# Patient Record
Sex: Female | Born: 1970
Health system: Southern US, Community
[De-identification: ages and names within clinical notes are randomized; demographics above are authoritative.]

## PROBLEM LIST (undated history)

## (undated) DIAGNOSIS — R519 Headache, unspecified: Secondary | ICD-10-CM

## (undated) DIAGNOSIS — C801 Malignant (primary) neoplasm, unspecified: Secondary | ICD-10-CM

## (undated) DIAGNOSIS — F419 Anxiety disorder, unspecified: Secondary | ICD-10-CM

## (undated) DIAGNOSIS — Z87442 Personal history of urinary calculi: Secondary | ICD-10-CM

## (undated) DIAGNOSIS — R51 Headache: Secondary | ICD-10-CM

## (undated) DIAGNOSIS — J189 Pneumonia, unspecified organism: Secondary | ICD-10-CM

## (undated) HISTORY — PX: KNEE SURGERY: SHX244

## (undated) HISTORY — PX: HAND SURGERY: SHX662

---

## 2010-02-09 DIAGNOSIS — J189 Pneumonia, unspecified organism: Secondary | ICD-10-CM

## 2010-02-09 HISTORY — DX: Pneumonia, unspecified organism: J18.9

## 2011-02-10 DIAGNOSIS — C801 Malignant (primary) neoplasm, unspecified: Secondary | ICD-10-CM

## 2011-02-10 HISTORY — PX: MASTECTOMY: SHX3

## 2011-02-10 HISTORY — DX: Malignant (primary) neoplasm, unspecified: C80.1

## 2016-09-14 ENCOUNTER — Encounter: Payer: Self-pay | Admitting: Internal Medicine

## 2016-11-16 DIAGNOSIS — Z880 Allergy status to penicillin: Secondary | ICD-10-CM | POA: Diagnosis not present

## 2016-11-16 DIAGNOSIS — R1084 Generalized abdominal pain: Secondary | ICD-10-CM | POA: Diagnosis not present

## 2016-11-16 DIAGNOSIS — Z853 Personal history of malignant neoplasm of breast: Secondary | ICD-10-CM | POA: Insufficient documentation

## 2016-11-16 DIAGNOSIS — K3580 Unspecified acute appendicitis: Principal | ICD-10-CM | POA: Insufficient documentation

## 2016-11-16 DIAGNOSIS — R109 Unspecified abdominal pain: Secondary | ICD-10-CM | POA: Diagnosis not present

## 2016-11-16 DIAGNOSIS — Z79899 Other long term (current) drug therapy: Secondary | ICD-10-CM | POA: Diagnosis not present

## 2016-11-16 DIAGNOSIS — Z9882 Breast implant status: Secondary | ICD-10-CM | POA: Diagnosis not present

## 2016-11-17 ENCOUNTER — Encounter (HOSPITAL_COMMUNITY)
Admission: EM | Disposition: A | Discharge status: Home / Self Care | Payer: Self-pay | Source: Home / Self Care | Attending: Emergency Medicine

## 2016-11-17 ENCOUNTER — Observation Stay (HOSPITAL_COMMUNITY): Payer: 59 | Admitting: Anesthesiology

## 2016-11-17 ENCOUNTER — Encounter (HOSPITAL_COMMUNITY): Payer: Self-pay

## 2016-11-17 ENCOUNTER — Observation Stay (HOSPITAL_COMMUNITY)
Admission: EM | Admit: 2016-11-17 | Discharge: 2016-11-18 | Disposition: A | Discharge status: Home / Self Care | Payer: 59 | Attending: General Surgery | Admitting: General Surgery

## 2016-11-17 ENCOUNTER — Emergency Department (HOSPITAL_COMMUNITY): Payer: 59

## 2016-11-17 DIAGNOSIS — Z853 Personal history of malignant neoplasm of breast: Secondary | ICD-10-CM | POA: Diagnosis not present

## 2016-11-17 DIAGNOSIS — Z880 Allergy status to penicillin: Secondary | ICD-10-CM | POA: Diagnosis not present

## 2016-11-17 DIAGNOSIS — R1084 Generalized abdominal pain: Secondary | ICD-10-CM | POA: Diagnosis not present

## 2016-11-17 DIAGNOSIS — K3580 Unspecified acute appendicitis: Secondary | ICD-10-CM

## 2016-11-17 DIAGNOSIS — K37 Unspecified appendicitis: Secondary | ICD-10-CM | POA: Diagnosis present

## 2016-11-17 DIAGNOSIS — R1031 Right lower quadrant pain: Secondary | ICD-10-CM | POA: Diagnosis not present

## 2016-11-17 HISTORY — PX: APPENDECTOMY: SHX54

## 2016-11-17 HISTORY — DX: Headache: R51

## 2016-11-17 HISTORY — DX: Personal history of urinary calculi: Z87.442

## 2016-11-17 HISTORY — DX: Malignant (primary) neoplasm, unspecified: C80.1

## 2016-11-17 HISTORY — PX: LAPAROSCOPIC APPENDECTOMY: SHX408

## 2016-11-17 HISTORY — DX: Headache, unspecified: R51.9

## 2016-11-17 LAB — CBC
HCT: 40.2 % (ref 36.0–46.0)
Hemoglobin: 13.6 g/dL (ref 12.0–15.0)
MCH: 31.7 pg (ref 26.0–34.0)
MCHC: 33.8 g/dL (ref 30.0–36.0)
MCV: 93.7 fL (ref 78.0–100.0)
PLATELETS: 232 10*3/uL (ref 150–400)
RBC: 4.29 MIL/uL (ref 3.87–5.11)
RDW: 12 % (ref 11.5–15.5)
WBC: 10.9 10*3/uL — AB (ref 4.0–10.5)

## 2016-11-17 LAB — COMPREHENSIVE METABOLIC PANEL
ALT: 15 U/L (ref 14–54)
ANION GAP: 10 (ref 5–15)
AST: 17 U/L (ref 15–41)
Albumin: 3.9 g/dL (ref 3.5–5.0)
Alkaline Phosphatase: 70 U/L (ref 38–126)
BUN: 16 mg/dL (ref 6–20)
CALCIUM: 9.1 mg/dL (ref 8.9–10.3)
CHLORIDE: 105 mmol/L (ref 101–111)
CO2: 23 mmol/L (ref 22–32)
CREATININE: 0.86 mg/dL (ref 0.44–1.00)
GFR calc non Af Amer: >60 mL/min (ref >60)
Glucose, Bld: 111 mg/dL — ABNORMAL HIGH (ref 65–99)
POTASSIUM: 3.7 mmol/L (ref 3.5–5.1)
SODIUM: 138 mmol/L (ref 135–145)
TOTAL PROTEIN: 6.6 g/dL (ref 6.5–8.1)
Total Bilirubin: 0.6 mg/dL (ref 0.3–1.2)

## 2016-11-17 LAB — URINALYSIS, ROUTINE W REFLEX MICROSCOPIC
Bilirubin Urine: NEGATIVE
GLUCOSE, UA: NEGATIVE mg/dL
KETONES UR: NEGATIVE mg/dL
LEUKOCYTES UA: NEGATIVE
NITRITE: NEGATIVE
PH: 5 (ref 5.0–8.0)
Protein, ur: NEGATIVE mg/dL
SPECIFIC GRAVITY, URINE: 1.018 (ref 1.005–1.030)

## 2016-11-17 LAB — TROPONIN I: Troponin I: <0.03 ng/mL (ref <0.03)

## 2016-11-17 LAB — LIPASE, BLOOD: LIPASE: 44 U/L (ref 11–51)

## 2016-11-17 LAB — PREGNANCY, URINE: PREG TEST UR: NEGATIVE

## 2016-11-17 LAB — I-STAT BETA HCG BLOOD, ED (MC, WL, AP ONLY)

## 2016-11-17 SURGERY — APPENDECTOMY, LAPAROSCOPIC
Anesthesia: General | Site: Abdomen

## 2016-11-17 MED ORDER — TEMAZEPAM 15 MG PO CAPS
15.0000 mg | ORAL_CAPSULE | Freq: Every day | ORAL | Status: DC
  Administered 2016-11-17: 15 mg via ORAL
  Filled 2016-11-17: qty 1

## 2016-11-17 MED ORDER — OXYCODONE HCL 5 MG PO TABS
ORAL_TABLET | ORAL | Status: AC
  Filled 2016-11-17: qty 1

## 2016-11-17 MED ORDER — ACETAMINOPHEN 650 MG RE SUPP: 650.0000 mg | Freq: Four times a day (QID) | RECTAL | Status: DC | PRN

## 2016-11-17 MED ORDER — BUPIVACAINE HCL (PF) 0.25 % IJ SOLN
INTRAMUSCULAR | Status: AC
Start: 2016-11-17 — End: 2016-11-17
  Filled 2016-11-17: qty 30

## 2016-11-17 MED ORDER — HYDROMORPHONE HCL 1 MG/ML IJ SOLN
1.0000 mg | Freq: Once | INTRAMUSCULAR | Status: AC
  Administered 2016-11-17: 1 mg via INTRAVENOUS
  Filled 2016-11-17: qty 1

## 2016-11-17 MED ORDER — ENOXAPARIN SODIUM 40 MG/0.4ML ~~LOC~~ SOLN
40.0000 mg | SUBCUTANEOUS | Status: DC
  Administered 2016-11-18: 40 mg via SUBCUTANEOUS
  Filled 2016-11-17: qty 0.4

## 2016-11-17 MED ORDER — ONDANSETRON HCL 4 MG/2ML IJ SOLN: 4.0000 mg | Freq: Four times a day (QID) | INTRAMUSCULAR | Status: DC | PRN

## 2016-11-17 MED ORDER — SODIUM CHLORIDE 0.9 % IR SOLN
Status: DC | PRN
  Administered 2016-11-17: 1000 mL

## 2016-11-17 MED ORDER — SUGAMMADEX SODIUM 200 MG/2ML IV SOLN
INTRAVENOUS | Status: AC
  Filled 2016-11-17: qty 2

## 2016-11-17 MED ORDER — DEXAMETHASONE SODIUM PHOSPHATE 10 MG/ML IJ SOLN
INTRAMUSCULAR | Status: AC
  Filled 2016-11-17: qty 1

## 2016-11-17 MED ORDER — SUGAMMADEX SODIUM 200 MG/2ML IV SOLN
INTRAVENOUS | Status: DC | PRN
  Administered 2016-11-17: 200 mg via INTRAVENOUS

## 2016-11-17 MED ORDER — OXYCODONE-ACETAMINOPHEN 5-325 MG PO TABS
ORAL_TABLET | ORAL | Status: AC
  Filled 2016-11-17: qty 1

## 2016-11-17 MED ORDER — FENTANYL CITRATE (PF) 250 MCG/5ML IJ SOLN
INTRAMUSCULAR | Status: AC
  Filled 2016-11-17: qty 5

## 2016-11-17 MED ORDER — CIPROFLOXACIN IN D5W 400 MG/200ML IV SOLN
400.0000 mg | Freq: Two times a day (BID) | INTRAVENOUS | Status: DC
  Administered 2016-11-17 – 2016-11-18 (×2): 400 mg via INTRAVENOUS
  Filled 2016-11-17 (×3): qty 200

## 2016-11-17 MED ORDER — BUPIVACAINE HCL 0.25 % IJ SOLN
INTRAMUSCULAR | Status: DC | PRN
Start: 2016-11-17 — End: 2016-11-17
  Administered 2016-11-17: 5 mL

## 2016-11-17 MED ORDER — DOCUSATE SODIUM 100 MG PO CAPS
100.0000 mg | ORAL_CAPSULE | Freq: Two times a day (BID) | ORAL | Status: DC
  Administered 2016-11-17 – 2016-11-18 (×2): 100 mg via ORAL
  Filled 2016-11-17 (×2): qty 1

## 2016-11-17 MED ORDER — SODIUM CHLORIDE 0.9 % IV SOLN
INTRAVENOUS | Status: DC
  Administered 2016-11-17 – 2016-11-18 (×2): via INTRAVENOUS

## 2016-11-17 MED ORDER — LACTATED RINGERS IV SOLN
INTRAVENOUS | Status: DC
  Administered 2016-11-17: 10:00:00 via INTRAVENOUS

## 2016-11-17 MED ORDER — ONDANSETRON HCL 4 MG/2ML IJ SOLN
INTRAMUSCULAR | Status: AC
  Filled 2016-11-17: qty 2

## 2016-11-17 MED ORDER — FENTANYL CITRATE (PF) 100 MCG/2ML IJ SOLN
25.0000 ug | INTRAMUSCULAR | Status: DC | PRN
  Administered 2016-11-17 (×2): 25 ug via INTRAVENOUS

## 2016-11-17 MED ORDER — FENTANYL CITRATE (PF) 100 MCG/2ML IJ SOLN
INTRAMUSCULAR | Status: AC
  Filled 2016-11-17: qty 2

## 2016-11-17 MED ORDER — MIDAZOLAM HCL 2 MG/2ML IJ SOLN
INTRAMUSCULAR | Status: AC
  Filled 2016-11-17: qty 2

## 2016-11-17 MED ORDER — ACETAMINOPHEN 325 MG PO TABS
650.0000 mg | ORAL_TABLET | Freq: Four times a day (QID) | ORAL | Status: DC | PRN
Start: 2016-11-17 — End: 2016-11-18

## 2016-11-17 MED ORDER — ONDANSETRON HCL 4 MG/2ML IJ SOLN: 4.0000 mg | Freq: Once | INTRAMUSCULAR | Status: DC | PRN

## 2016-11-17 MED ORDER — ONDANSETRON 4 MG PO TBDP
ORAL_TABLET | ORAL | Status: AC
  Filled 2016-11-17: qty 1

## 2016-11-17 MED ORDER — ADULT MULTIVITAMIN W/MINERALS CH
1.0000 | ORAL_TABLET | Freq: Every day | ORAL | Status: DC
Start: 2016-11-17 — End: 2016-11-18
  Administered 2016-11-17 – 2016-11-18 (×2): 1 via ORAL
  Filled 2016-11-17 (×2): qty 1

## 2016-11-17 MED ORDER — TRAMADOL HCL 50 MG PO TABS: 50.0000 mg | ORAL_TABLET | Freq: Four times a day (QID) | ORAL | Status: DC | PRN

## 2016-11-17 MED ORDER — METRONIDAZOLE IN NACL 5-0.79 MG/ML-% IV SOLN
500.0000 mg | Freq: Three times a day (TID) | INTRAVENOUS | Status: DC
  Administered 2016-11-17 – 2016-11-18 (×3): 500 mg via INTRAVENOUS
  Filled 2016-11-17 (×4): qty 100

## 2016-11-17 MED ORDER — ESCITALOPRAM OXALATE 20 MG PO TABS
20.0000 mg | ORAL_TABLET | Freq: Every day | ORAL | Status: DC
  Administered 2016-11-17 – 2016-11-18 (×2): 20 mg via ORAL
  Filled 2016-11-17 (×2): qty 1

## 2016-11-17 MED ORDER — 0.9 % SODIUM CHLORIDE (POUR BTL) OPTIME
TOPICAL | Status: DC | PRN
Start: 2016-11-17 — End: 2016-11-17
  Administered 2016-11-17: 1000 mL

## 2016-11-17 MED ORDER — FENTANYL CITRATE (PF) 100 MCG/2ML IJ SOLN
INTRAMUSCULAR | Status: DC | PRN
  Administered 2016-11-17 (×3): 50 ug via INTRAVENOUS

## 2016-11-17 MED ORDER — ONDANSETRON 4 MG PO TBDP
4.0000 mg | ORAL_TABLET | Freq: Once | ORAL | Status: AC | PRN
  Administered 2016-11-17: 4 mg via ORAL

## 2016-11-17 MED ORDER — PROPOFOL 10 MG/ML IV BOLUS
INTRAVENOUS | Status: AC
  Filled 2016-11-17: qty 20

## 2016-11-17 MED ORDER — VITAMIN D 1000 UNITS PO TABS
2000.0000 [IU] | ORAL_TABLET | Freq: Every day | ORAL | Status: DC
  Administered 2016-11-17 – 2016-11-18 (×2): 2000 [IU] via ORAL
  Filled 2016-11-17 (×2): qty 2

## 2016-11-17 MED ORDER — LIDOCAINE HCL (CARDIAC) 20 MG/ML IV SOLN
INTRAVENOUS | Status: DC | PRN
  Administered 2016-11-17: 80 mg via INTRAVENOUS

## 2016-11-17 MED ORDER — ENOXAPARIN SODIUM 40 MG/0.4ML ~~LOC~~ SOLN: 40.0000 mg | SUBCUTANEOUS | Status: DC

## 2016-11-17 MED ORDER — ONDANSETRON HCL 4 MG/2ML IJ SOLN
INTRAMUSCULAR | Status: DC | PRN
  Administered 2016-11-17: 4 mg via INTRAVENOUS

## 2016-11-17 MED ORDER — PANTOPRAZOLE SODIUM 40 MG IV SOLR
40.0000 mg | Freq: Every day | INTRAVENOUS | Status: DC
  Administered 2016-11-17: 40 mg via INTRAVENOUS
  Filled 2016-11-17: qty 40

## 2016-11-17 MED ORDER — TAMOXIFEN CITRATE 10 MG PO TABS
20.0000 mg | ORAL_TABLET | Freq: Every day | ORAL | Status: DC
  Administered 2016-11-17 – 2016-11-18 (×2): 20 mg via ORAL
  Filled 2016-11-17 (×2): qty 2

## 2016-11-17 MED ORDER — ONDANSETRON 4 MG PO TBDP: 4.0000 mg | ORAL_TABLET | Freq: Four times a day (QID) | ORAL | Status: DC | PRN

## 2016-11-17 MED ORDER — MIDAZOLAM HCL 5 MG/5ML IJ SOLN
INTRAMUSCULAR | Status: DC | PRN
  Administered 2016-11-17: 2 mg via INTRAVENOUS

## 2016-11-17 MED ORDER — POLYETHYLENE GLYCOL 3350 17 G PO PACK: 17.0000 g | PACK | Freq: Every day | ORAL | Status: DC | PRN

## 2016-11-17 MED ORDER — ONDANSETRON HCL 4 MG/2ML IJ SOLN
4.0000 mg | Freq: Once | INTRAMUSCULAR | Status: AC
  Administered 2016-11-17: 4 mg via INTRAVENOUS
  Filled 2016-11-17: qty 2

## 2016-11-17 MED ORDER — CIPROFLOXACIN IN D5W 400 MG/200ML IV SOLN
400.0000 mg | Freq: Once | INTRAVENOUS | Status: AC
  Administered 2016-11-17: 400 mg via INTRAVENOUS
  Filled 2016-11-17: qty 200

## 2016-11-17 MED ORDER — ROCURONIUM BROMIDE 10 MG/ML (PF) SYRINGE
PREFILLED_SYRINGE | INTRAVENOUS | Status: AC
  Filled 2016-11-17: qty 5

## 2016-11-17 MED ORDER — ROCURONIUM BROMIDE 100 MG/10ML IV SOLN
INTRAVENOUS | Status: DC | PRN
  Administered 2016-11-17: 30 mg via INTRAVENOUS

## 2016-11-17 MED ORDER — DEXAMETHASONE SODIUM PHOSPHATE 10 MG/ML IJ SOLN
INTRAMUSCULAR | Status: DC | PRN
  Administered 2016-11-17: 10 mg via INTRAVENOUS

## 2016-11-17 MED ORDER — LIDOCAINE 2% (20 MG/ML) 5 ML SYRINGE
INTRAMUSCULAR | Status: AC
  Filled 2016-11-17: qty 5

## 2016-11-17 MED ORDER — KCL IN DEXTROSE-NACL 20-5-0.45 MEQ/L-%-% IV SOLN
INTRAVENOUS | Status: DC
  Filled 2016-11-17: qty 1000

## 2016-11-17 MED ORDER — HYDROMORPHONE HCL 1 MG/ML IJ SOLN
1.0000 mg | INTRAMUSCULAR | Status: DC | PRN
  Administered 2016-11-17 – 2016-11-18 (×6): 1 mg via INTRAVENOUS
  Filled 2016-11-17 (×6): qty 1

## 2016-11-17 MED ORDER — OXYCODONE-ACETAMINOPHEN 5-325 MG PO TABS
1.0000 | ORAL_TABLET | ORAL | Status: DC | PRN
  Administered 2016-11-17: 1 via ORAL

## 2016-11-17 MED ORDER — PROPOFOL 10 MG/ML IV BOLUS
INTRAVENOUS | Status: DC | PRN
  Administered 2016-11-17: 110 mg via INTRAVENOUS

## 2016-11-17 MED ORDER — METRONIDAZOLE IN NACL 5-0.79 MG/ML-% IV SOLN
500.0000 mg | Freq: Once | INTRAVENOUS | Status: AC
  Administered 2016-11-17: 500 mg via INTRAVENOUS
  Filled 2016-11-17: qty 100

## 2016-11-17 MED ORDER — OXYCODONE HCL 5 MG PO TABS
5.0000 mg | ORAL_TABLET | ORAL | Status: DC | PRN
  Administered 2016-11-17 (×2): 10 mg via ORAL
  Administered 2016-11-17: 5 mg via ORAL
  Administered 2016-11-18 (×2): 10 mg via ORAL
  Filled 2016-11-17 (×4): qty 2

## 2016-11-17 MED ORDER — HYDRALAZINE HCL 20 MG/ML IJ SOLN: 10.0000 mg | INTRAMUSCULAR | Status: DC | PRN

## 2016-11-17 MED ORDER — MEPERIDINE HCL 25 MG/ML IJ SOLN: 6.2500 mg | INTRAMUSCULAR | Status: DC | PRN

## 2016-11-17 SURGICAL SUPPLY — 36 items
BENZOIN TINCTURE PRP APPL 2/3 (GAUZE/BANDAGES/DRESSINGS) ×3 IMPLANT
CANISTER SUCT 3000ML PPV (MISCELLANEOUS) ×3 IMPLANT
CHLORAPREP W/TINT 26ML (MISCELLANEOUS) ×3 IMPLANT
CLOSURE WOUND 1/2 X4 (GAUZE/BANDAGES/DRESSINGS) ×1
COVER SURGICAL LIGHT HANDLE (MISCELLANEOUS) ×3 IMPLANT
COVER TRANSDUCER ULTRASND (DRAPES) ×3 IMPLANT
ELECT REM PT RETURN 9FT ADLT (ELECTROSURGICAL) ×3
ELECTRODE REM PT RTRN 9FT ADLT (ELECTROSURGICAL) ×1 IMPLANT
ENDOLOOP SUT PDS II  0 18 (SUTURE) ×6
ENDOLOOP SUT PDS II 0 18 (SUTURE) ×3 IMPLANT
GAUZE SPONGE 2X2 8PLY STRL LF (GAUZE/BANDAGES/DRESSINGS) ×1 IMPLANT
GLOVE BIO SURGEON STRL SZ7.5 (GLOVE) ×3 IMPLANT
GOWN STRL REUS W/ TWL LRG LVL3 (GOWN DISPOSABLE) ×2 IMPLANT
GOWN STRL REUS W/ TWL XL LVL3 (GOWN DISPOSABLE) ×1 IMPLANT
GOWN STRL REUS W/TWL LRG LVL3 (GOWN DISPOSABLE) ×4
GOWN STRL REUS W/TWL XL LVL3 (GOWN DISPOSABLE) ×2
GRASPER SUT TROCAR 14GX15 (MISCELLANEOUS) ×3 IMPLANT
KIT BASIN OR (CUSTOM PROCEDURE TRAY) ×3 IMPLANT
KIT ROOM TURNOVER OR (KITS) ×3 IMPLANT
NEEDLE INSUFFLATION 14GA 120MM (NEEDLE) ×3 IMPLANT
NS IRRIG 1000ML POUR BTL (IV SOLUTION) ×3 IMPLANT
PAD ARMBOARD 7.5X6 YLW CONV (MISCELLANEOUS) ×6 IMPLANT
SCISSORS LAP 5X35 DISP (ENDOMECHANICALS) ×3 IMPLANT
SET IRRIG TUBING LAPAROSCOPIC (IRRIGATION / IRRIGATOR) ×3 IMPLANT
SLEEVE ENDOPATH XCEL 5M (ENDOMECHANICALS) ×3 IMPLANT
SPECIMEN JAR SMALL (MISCELLANEOUS) ×3 IMPLANT
SPONGE GAUZE 2X2 STER 10/PKG (GAUZE/BANDAGES/DRESSINGS) ×2
STRIP CLOSURE SKIN 1/2X4 (GAUZE/BANDAGES/DRESSINGS) ×2 IMPLANT
SUT MNCRL AB 4-0 PS2 18 (SUTURE) ×3 IMPLANT
TOWEL OR 17X24 6PK STRL BLUE (TOWEL DISPOSABLE) ×3 IMPLANT
TOWEL OR 17X26 10 PK STRL BLUE (TOWEL DISPOSABLE) ×3 IMPLANT
TRAY FOLEY CATH SILVER 16FR (SET/KITS/TRAYS/PACK) ×3 IMPLANT
TRAY LAPAROSCOPIC MC (CUSTOM PROCEDURE TRAY) ×3 IMPLANT
TROCAR XCEL NON-BLD 11X100MML (ENDOMECHANICALS) ×3 IMPLANT
TROCAR XCEL NON-BLD 5MMX100MML (ENDOMECHANICALS) ×3 IMPLANT
TUBING INSUFFLATION (TUBING) ×3 IMPLANT

## 2016-11-17 NOTE — Anesthesia Preprocedure Evaluation (Addendum)
Anesthesia Evaluation  Patient identified by MRN, date of birth, ID band Patient awake    Reviewed: Allergy & Precautions, NPO status , Patient's Chart, lab work & pertinent test results  Airway Mallampati: II  TM Distance: >3 FB Neck ROM: Full    Dental no notable dental hx. (+) Teeth Intact   Pulmonary neg pulmonary ROS,    Pulmonary exam normal breath sounds clear to auscultation       Cardiovascular negative cardio ROS Normal cardiovascular exam Rhythm:Regular Rate:Normal     Neuro/Psych negative neurological ROS  negative psych ROS   GI/Hepatic negative GI ROS, Neg liver ROS,   Endo/Other  negative endocrine ROS  Renal/GU negative Renal ROS  negative genitourinary   Musculoskeletal negative musculoskeletal ROS (+)   Abdominal   Peds negative pediatric ROS (+)  Hematology negative hematology ROS (+)   Anesthesia Other Findings   Reproductive/Obstetrics negative OB ROS                            Anesthesia Physical Anesthesia Plan  ASA: II  Anesthesia Plan: General   Post-op Pain Management:    Induction: Intravenous  PONV Risk Score and Plan: 3 and Ondansetron, Dexamethasone, Midazolam, Treatment may vary due to age or medical condition and Scopolamine patch - Pre-op  Airway Management Planned: Oral ETT  Additional Equipment:   Intra-op Plan:   Post-operative Plan: Extubation in OR  Informed Consent:   Plan Discussed with:   Anesthesia Plan Comments: (  )        Anesthesia Quick Evaluation

## 2016-11-17 NOTE — Anesthesia Postprocedure Evaluation (Signed)
Anesthesia Post Note  Patient: Kelli Bush  Procedure(s) Performed: APPENDECTOMY LAPAROSCOPIC (N/A Abdomen)     Patient location during evaluation: PACU Anesthesia Type: General Level of consciousness: awake and alert Pain management: pain level controlled Vital Signs Assessment: post-procedure vital signs reviewed and stable Respiratory status: spontaneous breathing, nonlabored ventilation, respiratory function stable and patient connected to nasal cannula oxygen Cardiovascular status: blood pressure returned to baseline and stable Postop Assessment: no apparent nausea or vomiting Anesthetic complications: no    Last Vitals:  Vitals:   11/17/16 1116 11/17/16 1135  BP: 110/62 (!) 110/54  Pulse: 76 75  Resp: 16 18  Temp: (!) 36.2 C 36.4 C  SpO2: 97% 95%    Last Pain:  Vitals:   11/17/16 1135  TempSrc: Oral  PainSc:                  Ieisha Gao

## 2016-11-17 NOTE — ED Notes (Signed)
Patient transported to CT scan . 

## 2016-11-17 NOTE — ED Provider Notes (Signed)
[facility] DEPT Provider Note   CSN: [id number] Arrival date & time: 11/16/16  2356     History   Chief Complaint Chief Complaint  Patient presents with  . Back Pain  . Abdominal Pain    HPI Kelli Bush is a 46 y.o. female.  Patient with past medical history remarkable for kidney stones presents to the emergency department with severe onset of left flank and abdominal pain. She reports symptoms started at 6 PM yesterday evening. She came to the emergency department at midnight. She states pain is severe. Nothing makes it better. She reports associated nausea and vomiting. She denies any fevers chills. Denies any vaginal discharge bleeding. Denies any dysuria, hematuria, frequency, or urgency. She was given Percocet in the triage area with no relief of her symptoms. She states that her last menstrual cycle was 5 years ago.   The history is provided by the patient. No language interpreter was used.    Past Medical History:  Diagnosis Date  . Cancer [facility])    breast     There are no active problems to display for this patient.   Past Surgical History:  Procedure Laterality Date  . HAND SURGERY    . KNEE SURGERY    . MASTECTOMY      OB History    No data available       Home Medications    Prior to Admission medications   Not on File    Family History No family history on file.  Social History Social History  Substance Use Topics  . Smoking status: Never Smoker  . Smokeless tobacco: Never Used  . Alcohol use No     Allergies   Penicillins   Review of Systems Review of Systems  All other systems reviewed and are negative.    Physical Exam Updated Vital Signs BP (!) 147/76 (BP Location: Left Arm)   Pulse (!) 104   Temp 98.6 F (37 C) (Oral)   Resp 18   SpO2 100%   Physical Exam  Constitutional: She is oriented to person, place, and time. She appears well-developed and well-nourished.  Visibly uncomfortable  HENT:  Head:  Normocephalic and atraumatic.  Eyes: Pupils are equal, round, and reactive to light. Conjunctivae and EOM are normal.  Neck: Normal range of motion. Neck supple.  Cardiovascular: Normal rate and regular rhythm.  Exam reveals no gallop and no friction rub.   No murmur heard. Pulmonary/Chest: Effort normal and breath sounds normal. No respiratory distress. She has no wheezes. She has no rales. She exhibits no tenderness.  Abdominal: Soft. Bowel sounds are normal. She exhibits no distension and no mass. There is tenderness. There is no rebound and no guarding.  Left sided flank and abdominal tenderness   Musculoskeletal: Normal range of motion. She exhibits no edema or tenderness.  Neurological: She is alert and oriented to person, place, and time.  Skin: Skin is warm and dry.  Psychiatric: She has a normal mood and affect. Her behavior is normal. Judgment and thought content normal.  Nursing note and vitals reviewed.    ED Treatments / Results  Labs (all labs ordered are listed, but only abnormal results are displayed) Labs Reviewed  COMPREHENSIVE METABOLIC PANEL - Abnormal; Notable for the following:       Result Value   Glucose, Bld 111 (*)    All other components within normal limits  CBC - Abnormal; Notable for the following:    WBC 10.9 (*)  All other components within normal limits  URINALYSIS, ROUTINE W REFLEX MICROSCOPIC - Abnormal; Notable for the following:    APPearance HAZY (*)    Hgb urine dipstick SMALL (*)    Bacteria, UA RARE (*)    Squamous Epithelial / LPF 0-5 (*)    All other components within normal limits  LIPASE, BLOOD  TROPONIN I  PREGNANCY, URINE  I-STAT BETA HCG BLOOD, ED ([facility], [facility], [facility] ONLY)    EKG  EKG Interpretation  Date/Time:  Tuesday November 17 2016 02:14:01 EDT Ventricular Rate:  84 PR Interval:  156 QRS Duration: 88 QT Interval:  400 QTC Calculation: 472 R Axis:   71 Text Interpretation:  Normal sinus rhythm with sinus arrhythmia Normal  ECG Confirmed by Orpah Greek [phone]) on 11/17/2016 4:02:17 AM       Radiology Ct Renal Stone Study  Result Date: 11/17/2016 CLINICAL DATA:  Acute onset of lower back and generalized abdominal pain. Initial encounter. EXAM: CT ABDOMEN AND PELVIS WITHOUT CONTRAST TECHNIQUE: Multidetector CT imaging of the abdomen and pelvis was performed following the standard protocol without IV contrast. COMPARISON:  None. FINDINGS: Lower chest: The visualized lung bases are grossly clear. The visualized portions of the mediastinum are unremarkable. The visualized portions of the breast implants are grossly unremarkable. Hepatobiliary: The liver is unremarkable in appearance. The gallbladder is unremarkable in appearance. The common bile duct remains normal in caliber. Pancreas: The pancreas is within normal limits. Spleen: The spleen is unremarkable in appearance. Adrenals/Urinary Tract: The adrenal glands are unremarkable in appearance. The kidneys are within normal limits. There is no evidence of hydronephrosis. No renal or ureteral stones are identified. No perinephric stranding is seen. Stomach/Bowel: There is dilatation of the appendix to 1.5 cm in diameter, with mild surrounding soft tissue inflammation, compatible with acute appendicitis. A few small appendicoliths are suggested at the base of the appendix. There is no definite evidence of perforation or abscess formation at this time. The appendix is partially retrocecal in nature. The colon is grossly unremarkable in appearance. The small bowel is grossly unremarkable. The stomach is within normal limits. Vascular/Lymphatic: The abdominal aorta is unremarkable in appearance. The inferior vena cava is grossly unremarkable. No retroperitoneal lymphadenopathy is seen. No pelvic sidewall lymphadenopathy is identified. Reproductive: There is diffuse soft tissue heterogeneity within the uterus, measuring 4.8 cm. Though this could reflect a fibroid, an  endometrial malignancy cannot be excluded. A 2.7 cm right adnexal cystic focus is likely physiologic in nature. The bladder is mildly distended and grossly unremarkable. Other: No additional soft tissue abnormalities are seen. Musculoskeletal: No acute osseous abnormalities are identified. Vacuum phenomenon is noted at L5-S1. The visualized musculature is unremarkable in appearance. IMPRESSION: 1. Dilatation of the appendix to 1.5 cm in diameter, with mild surrounding soft tissue inflammation, compatible with acute appendicitis. Few small appendicoliths suggested at the base of the appendix. No definite evidence of perforation or abscess formation at this time. The appendix is partially retrocecal in nature. 2. Diffuse soft tissue heterogeneity within the uterus, measuring 4.8 cm. Though this could reflect a poorly characterized fibroid, an endometrial malignancy cannot be excluded. Pelvic ultrasound is recommended for further evaluation, when and as deemed clinically appropriate. These results were called by telephone at the time of interpretation on 11/17/2016 at 5:52 am to [facility] PA, who verbally acknowledged these results. Electronically Signed   By: Garald Balding M.D.   On: 11/17/2016 05:54    Procedures Procedures (including critical care time)  Medications  Ordered in ED Medications  oxyCODONE-acetaminophen (PERCOCET/ROXICET) 5-325 MG per tablet 1 tablet (1 tablet Oral Given 11/17/16 0035)  oxyCODONE-acetaminophen (PERCOCET/ROXICET) 5-325 MG per tablet (not administered)  HYDROmorphone (DILAUDID) injection 1 mg (not administered)  ondansetron (ZOFRAN) injection 4 mg (not administered)  ondansetron (ZOFRAN-ODT) disintegrating tablet 4 mg (4 mg Oral Given 11/17/16 0035)     Initial Impression / Assessment and Plan / ED Course  I have reviewed the triage vital signs and the nursing notes.  Pertinent labs & imaging results that were available during my care of the patient were reviewed by  me and considered in my medical decision making (see chart for details).    Patient with severe, sudden onset left flank pain. History of kidney stones. We'll treat pain. Will check CT renal study. Hemoglobin is noted in the urine.  CT is consistent with acute appendicitis.  I discussed case with Dr. Donne Hazel, who will come to evaluate the patient. I will start antibiotics.  CT also shows unclear heterogeneity of the uterus. Radiology recommends pelvic ultrasound. I discussed these findings with patient, who states that she has a pelvic ultrasound scheduled for later this month with her OB/GYN. Final Clinical Impressions(s) / ED Diagnoses   Final diagnoses:  Acute appendicitis, unspecified acute appendicitis type    New Prescriptions New Prescriptions   No medications on file     Montine Circle, PA-C 11/17/16 1031    Orpah Greek, MD 11/17/16 [phone]

## 2016-11-17 NOTE — ED Notes (Signed)
Unable to locate patient.

## 2016-11-17 NOTE — ED Triage Notes (Signed)
Pt states back pain started about six hours ago, states it shoots from her neck to her sacrum and into abd area, vomited x 5. Denies urinary symptoms.

## 2016-11-17 NOTE — OR Nursing (Signed)
Patient voided 5 minutes prior to transport to OR.  No foley needed per MD.

## 2016-11-17 NOTE — H&P (Signed)
Kelli Bush is an 46 y.o. female.   Chief Complaint: abd and back pain HPI: 54 yof with history breast cancer otherwise healthy presents with acute onset back pain and generalized abdominal pain. This has been associated with n/v.  She tried multiple treatments at home including showers, heat and pain med and nothing was helping it. Pain got worse.  She then came to er.  She has had history kidney stones and thought that is what this was.  Underwent renal stone ct and appears she has appendicitis.   Past Medical History:  Diagnosis Date  . Cancer [facility])    breast   treated in [place] with bilateral mastectomy/reconstruction/chemo/ rads- right breast cancer  Past Surgical History:  Procedure Laterality Date  . HAND SURGERY    . KNEE SURGERY    . MASTECTOMY      No family history on file. Social History:  reports that she has never smoked. She has never used smokeless tobacco. She reports that she does not drink alcohol. Her drug history is not on file.  Allergies:  Allergies  Allergen Reactions  . Penicillins Hives    meds reviewed  Results for orders placed or performed during the hospital encounter of 11/17/16 (from the past 48 hour(s))  Lipase, blood     Status: None   Collection Time: 11/17/16 12:25 AM  Result Value Ref Range   Lipase 44 11 - 51 U/L  Comprehensive metabolic panel     Status: Abnormal   Collection Time: 11/17/16 12:25 AM  Result Value Ref Range   Sodium 138 135 - 145 mmol/L   Potassium 3.7 3.5 - 5.1 mmol/L   Chloride 105 101 - 111 mmol/L   CO2 23 22 - 32 mmol/L   Glucose, Bld 111 (H) 65 - 99 mg/dL   BUN 16 6 - 20 mg/dL   Creatinine, Ser 0.86 0.44 - 1.00 mg/dL   Calcium 9.1 8.9 - 10.3 mg/dL   Total Protein 6.6 6.5 - 8.1 g/dL   Albumin 3.9 3.5 - 5.0 g/dL   AST 17 15 - 41 U/L   ALT 15 14 - 54 U/L   Alkaline Phosphatase 70 38 - 126 U/L   Total Bilirubin 0.6 0.3 - 1.2 mg/dL   GFR calc non Af Amer >60 >60 mL/min   GFR calc Af Amer >60 >60 mL/min   Comment: (NOTE) The eGFR has been calculated using the CKD EPI equation. This calculation has not been validated in all clinical situations. eGFR's persistently <60 mL/min signify possible Chronic Kidney Disease.    Anion gap 10 5 - 15  CBC     Status: Abnormal   Collection Time: 11/17/16 12:25 AM  Result Value Ref Range   WBC 10.9 (H) 4.0 - 10.5 K/uL   RBC 4.29 3.87 - 5.11 MIL/uL   Hemoglobin 13.6 12.0 - 15.0 g/dL   HCT 40.2 36.0 - 46.0 %   MCV 93.7 78.0 - 100.0 fL   MCH 31.7 26.0 - 34.0 pg   MCHC 33.8 30.0 - 36.0 g/dL   RDW 12.0 11.5 - 15.5 %   Platelets 232 150 - 400 K/uL  Urinalysis, Routine w reflex microscopic     Status: Abnormal   Collection Time: 11/17/16 12:25 AM  Result Value Ref Range   Color, Urine YELLOW YELLOW   APPearance HAZY (A) CLEAR   Specific Gravity, Urine 1.018 1.005 - 1.030   pH 5.0 5.0 - 8.0   Glucose, UA NEGATIVE NEGATIVE mg/dL  Hgb urine dipstick SMALL (A) NEGATIVE   Bilirubin Urine NEGATIVE NEGATIVE   Ketones, ur NEGATIVE NEGATIVE mg/dL   Protein, ur NEGATIVE NEGATIVE mg/dL   Nitrite NEGATIVE NEGATIVE   Leukocytes, UA NEGATIVE NEGATIVE   RBC / HPF 0-5 0 - 5 RBC/hpf   WBC, UA 0-5 0 - 5 WBC/hpf   Bacteria, UA RARE (A) NONE SEEN   Squamous Epithelial / LPF 0-5 (A) NONE SEEN   Mucus PRESENT   Pregnancy, urine     Status: None   Collection Time: 11/17/16 12:25 AM  Result Value Ref Range   Preg Test, Ur NEGATIVE NEGATIVE    Comment:        THE SENSITIVITY OF THIS METHODOLOGY IS >20 mIU/mL.   Troponin I     Status: None   Collection Time: 11/17/16  5:00 AM  Result Value Ref Range   Troponin I <0.03 <0.03 ng/mL   Ct Renal Stone Study  Result Date: 11/17/2016 CLINICAL DATA:  Acute onset of lower back and generalized abdominal pain. Initial encounter. EXAM: CT ABDOMEN AND PELVIS WITHOUT CONTRAST TECHNIQUE: Multidetector CT imaging of the abdomen and pelvis was performed following the standard protocol without IV contrast. COMPARISON:  None.  FINDINGS: Lower chest: The visualized lung bases are grossly clear. The visualized portions of the mediastinum are unremarkable. The visualized portions of the breast implants are grossly unremarkable. Hepatobiliary: The liver is unremarkable in appearance. The gallbladder is unremarkable in appearance. The common bile duct remains normal in caliber. Pancreas: The pancreas is within normal limits. Spleen: The spleen is unremarkable in appearance. Adrenals/Urinary Tract: The adrenal glands are unremarkable in appearance. The kidneys are within normal limits. There is no evidence of hydronephrosis. No renal or ureteral stones are identified. No perinephric stranding is seen. Stomach/Bowel: There is dilatation of the appendix to 1.5 cm in diameter, with mild surrounding soft tissue inflammation, compatible with acute appendicitis. A few small appendicoliths are suggested at the base of the appendix. There is no definite evidence of perforation or abscess formation at this time. The appendix is partially retrocecal in nature. The colon is grossly unremarkable in appearance. The small bowel is grossly unremarkable. The stomach is within normal limits. Vascular/Lymphatic: The abdominal aorta is unremarkable in appearance. The inferior vena cava is grossly unremarkable. No retroperitoneal lymphadenopathy is seen. No pelvic sidewall lymphadenopathy is identified. Reproductive: There is diffuse soft tissue heterogeneity within the uterus, measuring 4.8 cm. Though this could reflect a fibroid, an endometrial malignancy cannot be excluded. A 2.7 cm right adnexal cystic focus is likely physiologic in nature. The bladder is mildly distended and grossly unremarkable. Other: No additional soft tissue abnormalities are seen. Musculoskeletal: No acute osseous abnormalities are identified. Vacuum phenomenon is noted at L5-S1. The visualized musculature is unremarkable in appearance. IMPRESSION: 1. Dilatation of the appendix to 1.5 cm  in diameter, with mild surrounding soft tissue inflammation, compatible with acute appendicitis. Few small appendicoliths suggested at the base of the appendix. No definite evidence of perforation or abscess formation at this time. The appendix is partially retrocecal in nature. 2. Diffuse soft tissue heterogeneity within the uterus, measuring 4.8 cm. Though this could reflect a poorly characterized fibroid, an endometrial malignancy cannot be excluded. Pelvic ultrasound is recommended for further evaluation, when and as deemed clinically appropriate. These results were called by telephone at the time of interpretation on 11/17/2016 at 5:52 am to [facility] PA, who verbally acknowledged these results. Electronically Signed   By: Francoise Schaumann.D.  On: 11/17/2016 05:54    Review of Systems  Gastrointestinal: Positive for abdominal pain, nausea and vomiting.  Genitourinary: Positive for flank pain.  All other systems reviewed and are negative.   Blood pressure 117/65, pulse 65, temperature 98.6 F (37 C), temperature source Oral, resp. rate 18, SpO2 100 %. Physical Exam  Vitals reviewed. Constitutional: She is oriented to person, place, and time. She appears well-developed and well-nourished.  HENT:  Head: Normocephalic and atraumatic.  Eyes: No scleral icterus.  Neck: Neck supple. No tracheal deviation present. No thyromegaly present.  Cardiovascular: Normal rate, regular rhythm and normal heart sounds.   Respiratory: Effort normal and breath sounds normal. She has no wheezes. She has no rales.  GI: Soft. She exhibits no distension. Bowel sounds are decreased. There is tenderness (generalized tenderness most prominent rlq and suprapubic) in the right lower quadrant. No hernia.  Lymphadenopathy:    She has no cervical adenopathy.  Neurological: She is alert and oriented to person, place, and time.  Skin: Skin is warm and dry.     Assessment/Plan Appendicitis  Appears she has  appendicitis by ct and likely by exam. Will need pelvis us later Discussed lap appy today with Dr Ramirez  WAKEFIELD,MATTHEW, MD 11/17/2016, 6:23 AM   

## 2016-11-17 NOTE — Transfer of Care (Signed)
Immediate Anesthesia Transfer of Care Note  Patient: Kelli Bush  Procedure(s) Performed: APPENDECTOMY LAPAROSCOPIC (N/A Abdomen)  Patient Location: PACU  Anesthesia Type:General  Level of Consciousness: awake, alert  and oriented  Airway & Oxygen Therapy: Patient Spontanous Breathing and Patient connected to nasal cannula oxygen  Post-op Assessment: Report given to RN, Post -op Vital signs reviewed and stable and Patient moving all extremities  Post vital signs: Reviewed and stable  Last Vitals:  Vitals:   11/17/16 0906 11/17/16 1034  BP: 111/63   Pulse: 70   Resp:    Temp:  (!) 36.1 C  SpO2: 97%     Last Pain:  Vitals:   11/17/16 0858  TempSrc: Oral  PainSc:          Complications: No apparent anesthesia complications

## 2016-11-17 NOTE — Op Note (Signed)
11/17/2016  10:20 AM  PATIENT:  Kelli Bush  46 y.o. female  PRE-OPERATIVE DIAGNOSIS:  Acute Appendicitis  POST-OPERATIVE DIAGNOSIS:  Acute Appendicitis  PROCEDURE:  Procedure(s): APPENDECTOMY LAPAROSCOPIC (N/A)  SURGEON:  Surgeon(s) and Role:    Ralene Ok, MD - Primary  ANESTHESIA:   local and general  EBL: 5cc Total I/O In: 150 [IV Piggyback:150] Out: -   BLOOD ADMINISTERED:none  DRAINS: none   LOCAL MEDICATIONS USED:  BUPIVICAINE   SPECIMEN:  Source of Specimen:  appendix  DISPOSITION OF SPECIMEN:  PATHOLOGY  COUNTS:  YES  TOURNIQUET:  * No tourniquets in log *  DICTATION: .Dragon Dictation Complications: none  Counts: reported as correct x 2  Findings:  The patient had a acutely inflamed non perforated appendix  Specimen: Appendix  Indications for procedure:  The patient is a 46 year old female with a history of periumbilical pain localized in the right lower quadrant patient had a CT scan which revealed signs consistent with acute appendicitis the patient back in for laparoscopic appendectomy.  Details of the procedure:The patient was taken back to the operating room. The patient was placed in supine position with bilateral SCDs in place.  A foley catheter was place. The patient was prepped and draped in the usual sterile fashion.  After appropriate anitbiotics were confirmed, a time-out was confirmed and all facts were verified.    A pneumoperitoneum of 14 mmHg was obtained via a Veress needle technique in the left lower quadrant quadrant.  A 5 mm trocar and 5 mm camera then placed intra-abdominally there is no injury to any intra-abdominal organs a 10 mm infraumbilical port was placed and direct visualization as was a 5 mm port in the suprapubic area.   The appendix was identified and seen to be non-perforated.  The appendix was cleaned down to the appendiceal base. The mesoappendix was then incised and the appendiceal artery was cauterized.  The  the appendiceal base was clean.  At this time an Endoloop was placed proximallyx2 and one distally and the appendix was transected between these 2. A retrieval bag was then placed into the abdomen and the specimen placed in the bag. The appendiceal stump was cauterized. We evacuate the fluid from the pelvis until the effluent was clear.  The appendix and retrieval  bag was then retrieved via the supraumbilical port. #1 Vicryl was used to reapproximate the fascia at the umbilical port site x1. The skin was reapproximated all port sites 3-0 Monocryl subcuticular fashion. The skin was dressed with steri-strips, guaze, and tape.  The patient had the foley removed. The patient was awakened from general anesthesia was taken to recovery room in stable condition.      PLAN OF CARE: Admit for overnight observation  PATIENT DISPOSITION:  PACU - hemodynamically stable.   Delay start of Pharmacological VTE agent (>24hrs) due to surgical blood loss or risk of bleeding: not applicable

## 2016-11-17 NOTE — ED Notes (Signed)
Patient was brought back for ekg and after patient requested a wheelchair to be wheeled out to the lobby in. This EMT went to grab a wheelchair for patient and upon arrival to triage room the patient had lowered herself from the chair she was in to the fetal position on the floor. Patient got back up and into the wheelchair, was rolled out to lobby.

## 2016-11-17 NOTE — ED Notes (Signed)
Pt now c/o of CP and SOB

## 2016-11-17 NOTE — Anesthesia Procedure Notes (Signed)
Procedure Name: Intubation Date/Time: 11/17/2016 9:49 AM Performed by: Kyung Rudd Pre-anesthesia Checklist: Patient identified, Emergency Drugs available, Suction available and Patient being monitored Patient Re-evaluated:Patient Re-evaluated prior to induction Oxygen Delivery Method: Circle system utilized Preoxygenation: Pre-oxygenation with 100% oxygen Induction Type: IV induction Ventilation: Mask ventilation without difficulty Laryngoscope Size: Mac and 3 Grade View: Grade I Tube type: Oral Tube size: 7.0 mm Number of attempts: 1 Airway Equipment and Method: Stylet Placement Confirmation: ETT inserted through vocal cords under direct vision,  positive ETCO2 and breath sounds checked- equal and bilateral Secured at: 21 cm Tube secured with: Tape Dental Injury: Teeth and Oropharynx as per pre-operative assessment

## 2016-11-17 NOTE — ED Notes (Signed)
Dr. Wakefield at bedside. 

## 2016-11-18 ENCOUNTER — Encounter (HOSPITAL_COMMUNITY): Payer: Self-pay | Admitting: General Surgery

## 2016-11-18 MED ORDER — ACETAMINOPHEN 325 MG PO TABS: 500.0000 mg | ORAL_TABLET | Freq: Four times a day (QID) | ORAL | Status: DC

## 2016-11-18 MED ORDER — ACETAMINOPHEN 650 MG RE SUPP: 650.0000 mg | Freq: Four times a day (QID) | RECTAL | Status: DC

## 2016-11-18 MED ORDER — OXYCODONE HCL 5 MG PO TABS
5.0000 mg | ORAL_TABLET | ORAL | Status: DC | PRN
  Administered 2016-11-18: 5 mg via ORAL
  Filled 2016-11-18: qty 2

## 2016-11-18 MED ORDER — HYDROMORPHONE HCL 1 MG/ML IJ SOLN: 0.5000 mg | INTRAMUSCULAR | Status: DC | PRN

## 2016-11-18 MED ORDER — OXYCODONE HCL 5 MG PO TABS: 5.0000 mg | ORAL_TABLET | ORAL | 0 refills | Status: DC | PRN

## 2016-11-18 MED ORDER — IBUPROFEN 800 MG PO TABS
800.0000 mg | ORAL_TABLET | Freq: Three times a day (TID) | ORAL | Status: DC
  Administered 2016-11-18 (×2): 800 mg via ORAL
  Filled 2016-11-18 (×2): qty 1

## 2016-11-18 MED ORDER — METHOCARBAMOL 500 MG PO TABS
500.0000 mg | ORAL_TABLET | Freq: Three times a day (TID) | ORAL | 0 refills | Status: DC
Start: 2016-11-18 — End: 2016-11-18

## 2016-11-18 MED ORDER — ACETAMINOPHEN 325 MG PO TABS
650.0000 mg | ORAL_TABLET | Freq: Four times a day (QID) | ORAL | Status: DC
  Filled 2016-11-18: qty 2

## 2016-11-18 MED ORDER — METHOCARBAMOL 500 MG PO TABS: 500.0000 mg | ORAL_TABLET | Freq: Three times a day (TID) | ORAL | 0 refills | Status: DC

## 2016-11-18 MED ORDER — METHOCARBAMOL 500 MG PO TABS
500.0000 mg | ORAL_TABLET | Freq: Three times a day (TID) | ORAL | Status: DC
  Administered 2016-11-18 (×2): 500 mg via ORAL
  Filled 2016-11-18 (×2): qty 1

## 2016-11-18 MED ORDER — IBUPROFEN 800 MG PO TABS: 800.0000 mg | ORAL_TABLET | Freq: Three times a day (TID) | ORAL | 0 refills | Status: DC

## 2016-11-18 MED ORDER — KETOROLAC TROMETHAMINE 30 MG/ML IJ SOLN
30.0000 mg | Freq: Three times a day (TID) | INTRAMUSCULAR | Status: DC | PRN
  Administered 2016-11-18: 30 mg via INTRAVENOUS
  Filled 2016-11-18 (×2): qty 1

## 2016-11-18 NOTE — Discharge Summary (Signed)
[facility] Discharge Summary   Patient ID: Kelli Bush MRN: [id number] DOB/AGE: 10/21/1970 46 y.o.  Admit date: 11/17/2016 Discharge date: 11/18/2016  Admitting Diagnosis: Acute appendicitis  Discharge Diagnosis Patient Active Problem List   Diagnosis Date Noted  . Appendicitis 11/17/2016  . Acute appendicitis 11/17/2016    Consultants None  Imaging: Ct Renal Stone Study  Result Date: 11/17/2016 CLINICAL DATA:  Acute onset of lower back and generalized abdominal pain. Initial encounter. EXAM: CT ABDOMEN AND PELVIS WITHOUT CONTRAST TECHNIQUE: Multidetector CT imaging of the abdomen and pelvis was performed following the standard protocol without IV contrast. COMPARISON:  None. FINDINGS: Lower chest: The visualized lung bases are grossly clear. The visualized portions of the mediastinum are unremarkable. The visualized portions of the breast implants are grossly unremarkable. Hepatobiliary: The liver is unremarkable in appearance. The gallbladder is unremarkable in appearance. The common bile duct remains normal in caliber. Pancreas: The pancreas is within normal limits. Spleen: The spleen is unremarkable in appearance. Adrenals/Urinary Tract: The adrenal glands are unremarkable in appearance. The kidneys are within normal limits. There is no evidence of hydronephrosis. No renal or ureteral stones are identified. No perinephric stranding is seen. Stomach/Bowel: There is dilatation of the appendix to 1.5 cm in diameter, with mild surrounding soft tissue inflammation, compatible with acute appendicitis. A few small appendicoliths are suggested at the base of the appendix. There is no definite evidence of perforation or abscess formation at this time. The appendix is partially retrocecal in nature. The colon is grossly unremarkable in appearance. The small bowel is grossly unremarkable. The stomach is within normal limits. Vascular/Lymphatic: The abdominal aorta is unremarkable  in appearance. The inferior vena cava is grossly unremarkable. No retroperitoneal lymphadenopathy is seen. No pelvic sidewall lymphadenopathy is identified. Reproductive: There is diffuse soft tissue heterogeneity within the uterus, measuring 4.8 cm. Though this could reflect a fibroid, an endometrial malignancy cannot be excluded. A 2.7 cm right adnexal cystic focus is likely physiologic in nature. The bladder is mildly distended and grossly unremarkable. Other: No additional soft tissue abnormalities are seen. Musculoskeletal: No acute osseous abnormalities are identified. Vacuum phenomenon is noted at L5-S1. The visualized musculature is unremarkable in appearance. IMPRESSION: 1. Dilatation of the appendix to 1.5 cm in diameter, with mild surrounding soft tissue inflammation, compatible with acute appendicitis. Few small appendicoliths suggested at the base of the appendix. No definite evidence of perforation or abscess formation at this time. The appendix is partially retrocecal in nature. 2. Diffuse soft tissue heterogeneity within the uterus, measuring 4.8 cm. Though this could reflect a poorly characterized fibroid, an endometrial malignancy cannot be excluded. Pelvic ultrasound is recommended for further evaluation, when and as deemed clinically appropriate. These results were called by telephone at the time of interpretation on 11/17/2016 at 5:52 am to [facility] PA, who verbally acknowledged these results. Electronically Signed   By: Garald Balding M.D.   On: 11/17/2016 05:54    Procedures Dr. Rosendo Gros (11/17/16) - Laparoscopic Appendectomy  Hospital Course:  Kelli Bush is a 46yo female who presented to [facility] 10/9 with acute obset back and generalized abdominal pain associated with n/v.  Workup included CT scan which showed acute appendicitis.  Patient was admitted and underwent procedure listed above.  Tolerated procedure well and was transferred to the floor.  Diet was advanced as tolerated.   On POD1 the patient was voiding well, tolerating diet, ambulating well, pain well controlled, vital signs stable, incisions c/d/i and felt stable for discharge home.  Patient will follow up in our office in 2-3 weeks and knows to call with questions or concerns.    I have personally reviewed the patients medication history on the [place] controlled substance database.    Physical Exam: General:  Alert, NAD, pleasant, comfortable Cardio: RRR Pulm: effort normal Abd:  Soft, ND, appropriately tender, multiple lap incisions with clean/dry dressings  Allergies as of 11/18/2016      Reactions   Penicillins Hives      Medication List    TAKE these medications   acetaminophen 325 MG tablet Commonly known as:  TYLENOL Take 1.5 tablets (487.5 mg total) by mouth every 6 (six) hours.   escitalopram 20 MG tablet Commonly known as:  LEXAPRO Take 20 mg by mouth daily.   ibuprofen 800 MG tablet Commonly known as:  ADVIL,MOTRIN Take 1 tablet (800 mg total) by mouth 3 (three) times daily.   methocarbamol 500 MG tablet Commonly known as:  ROBAXIN Take 1 tablet (500 mg total) by mouth 3 (three) times daily.   multivitamin with minerals Tabs tablet Take 1 tablet by mouth daily.   oxyCODONE 5 MG immediate release tablet Commonly known as:  Oxy IR/ROXICODONE Take 1 tablet (5 mg total) by mouth every 4 (four) hours as needed for moderate pain or severe pain.   tamoxifen 20 MG tablet Commonly known as:  NOLVADEX Take 20 mg by mouth daily.   temazepam 15 MG capsule Commonly known as:  RESTORIL Take 15 mg by mouth daily.   Vitamin D3 2000 units capsule Take 2,000 Units by mouth daily.        Follow-up [facility], [place]. Go on 12/01/2016.   Specialty:  General Surgery Why:  Your appointment is 12/01/16 at 10:00 am. Please arrive 30 minutes prior to your appointment to check in and fill out paperwork. Bring photo ID. Contact information: [address] [place] [phone]          Signed: Wellington Hampshire, [facility] 11/18/2016, 9:26 AM Pager: [phone] Consults: [phone] Mon-Fri 7:00 am-4:30 pm Sat-Sun 7:00 am-11:30 am

## 2016-11-18 NOTE — Progress Notes (Signed)
Discharge instructions with appointment follow up were discussed with patient. Patient was educated on new medications and expressed understanding. Patient left unit with family in stable condition.

## 2016-11-20 ENCOUNTER — Ambulatory Visit: Payer: Self-pay | Admitting: Internal Medicine

## 2016-12-08 ENCOUNTER — Other Ambulatory Visit (HOSPITAL_COMMUNITY)
Admission: RE | Admit: 2016-12-08 | Discharge: 2016-12-08 | Disposition: A | Discharge status: Home / Self Care | Payer: 59 | Source: Ambulatory Visit | Attending: Obstetrics and Gynecology | Admitting: Obstetrics and Gynecology

## 2016-12-08 ENCOUNTER — Other Ambulatory Visit: Payer: Self-pay | Admitting: Obstetrics and Gynecology

## 2016-12-08 DIAGNOSIS — Z124 Encounter for screening for malignant neoplasm of cervix: Secondary | ICD-10-CM | POA: Insufficient documentation

## 2016-12-08 DIAGNOSIS — Z9221 Personal history of antineoplastic chemotherapy: Secondary | ICD-10-CM | POA: Diagnosis not present

## 2016-12-10 LAB — CYTOLOGY - PAP
DIAGNOSIS: NEGATIVE
HPV: NOT DETECTED

## 2016-12-18 ENCOUNTER — Other Ambulatory Visit: Payer: Self-pay | Admitting: Obstetrics and Gynecology

## 2016-12-28 NOTE — Patient Instructions (Addendum)
Your procedure is scheduled on: Friday January 15, 2017 at 11:45 am   Enter through the [employer] of [facility] at: 10:15 am  Pick up the phone at the desk and dial [phone].  Call this number if you have problems the morning of surgery: [phone].  Remember: Do NOT eat food: after Midnight on Thursday December 6  Do NOT drink clear liquids after: 5:45 am Take these medicines the morning of surgery with a SIP OF WATER:  NONE  Do NOT wear jewelry (body piercing), metal hair clips/bobby pins, make-up, artificial eye lashes or nail polish. Do NOT wear lotions, powders, or perfumes.  You may wear deoderant. Do NOT shave for 48 hours prior to surgery. Do NOT bring valuables to the hospital. Contacts, dentures, or bridgework may not be worn into surgery.  Leave suitcase in car.  After surgery it may be brought to your room.  For patients admitted to the hospital, checkout time is 11:00 AM the day of discharge.

## 2016-12-30 ENCOUNTER — Encounter (HOSPITAL_COMMUNITY): Payer: Self-pay

## 2016-12-30 ENCOUNTER — Encounter (HOSPITAL_COMMUNITY)
Admission: RE | Admit: 2016-12-30 | Discharge: 2016-12-30 | Disposition: A | Discharge status: Home / Self Care | Payer: 59 | Source: Ambulatory Visit | Attending: Obstetrics and Gynecology | Admitting: Obstetrics and Gynecology

## 2016-12-30 ENCOUNTER — Other Ambulatory Visit: Payer: Self-pay

## 2016-12-30 ENCOUNTER — Other Ambulatory Visit: Payer: Self-pay | Admitting: Obstetrics and Gynecology

## 2016-12-30 DIAGNOSIS — Z01818 Encounter for other preprocedural examination: Secondary | ICD-10-CM | POA: Insufficient documentation

## 2016-12-30 DIAGNOSIS — N760 Acute vaginitis: Secondary | ICD-10-CM | POA: Diagnosis not present

## 2016-12-30 DIAGNOSIS — N762 Acute vulvitis: Secondary | ICD-10-CM | POA: Diagnosis not present

## 2016-12-30 HISTORY — DX: Pneumonia, unspecified organism: J18.9

## 2016-12-30 HISTORY — DX: Anxiety disorder, unspecified: F41.9

## 2016-12-30 LAB — CBC
HCT: 39.7 % (ref 36.0–46.0)
HEMOGLOBIN: 13.2 g/dL (ref 12.0–15.0)
MCH: 32 pg (ref 26.0–34.0)
MCHC: 33.2 g/dL (ref 30.0–36.0)
MCV: 96.1 fL (ref 78.0–100.0)
Platelets: 240 10*3/uL (ref 150–400)
RBC: 4.13 MIL/uL (ref 3.87–5.11)
RDW: 12.4 % (ref 11.5–15.5)
WBC: 6 10*3/uL (ref 4.0–10.5)

## 2016-12-30 LAB — COMPREHENSIVE METABOLIC PANEL
ALBUMIN: 3.8 g/dL (ref 3.5–5.0)
ALK PHOS: 78 U/L (ref 38–126)
ALT: 17 U/L (ref 14–54)
ANION GAP: 6 (ref 5–15)
AST: 18 U/L (ref 15–41)
BUN: 19 mg/dL (ref 6–20)
CALCIUM: 8.9 mg/dL (ref 8.9–10.3)
CO2: 26 mmol/L (ref 22–32)
Chloride: 107 mmol/L (ref 101–111)
Creatinine, Ser: 0.82 mg/dL (ref 0.44–1.00)
GFR calc Af Amer: >60 mL/min (ref >60)
GFR calc non Af Amer: >60 mL/min (ref >60)
GLUCOSE: 88 mg/dL (ref 65–99)
Potassium: 3.8 mmol/L (ref 3.5–5.1)
SODIUM: 139 mmol/L (ref 135–145)
Total Bilirubin: 0.5 mg/dL (ref 0.3–1.2)
Total Protein: 6.9 g/dL (ref 6.5–8.1)

## 2016-12-30 LAB — TYPE AND SCREEN
ABO/RH(D): B POS
Antibody Screen: NEGATIVE

## 2016-12-30 NOTE — Pre-Procedure Instructions (Signed)
NO BP'S STICKS RIGHT ARM

## 2017-01-15 ENCOUNTER — Encounter (HOSPITAL_COMMUNITY): Payer: Self-pay | Admitting: Anesthesiology

## 2017-01-15 ENCOUNTER — Encounter (HOSPITAL_COMMUNITY)
Admission: AD | Disposition: A | Discharge status: Home / Self Care | Payer: Self-pay | Source: Ambulatory Visit | Attending: Obstetrics and Gynecology

## 2017-01-15 ENCOUNTER — Ambulatory Visit (HOSPITAL_COMMUNITY): Payer: 59 | Admitting: Anesthesiology

## 2017-01-15 ENCOUNTER — Other Ambulatory Visit: Payer: Self-pay

## 2017-01-15 ENCOUNTER — Observation Stay (HOSPITAL_COMMUNITY)
Admission: AD | Admit: 2017-01-15 | Discharge: 2017-01-16 | Disposition: A | Discharge status: Home / Self Care | Payer: 59 | Source: Ambulatory Visit | Attending: Obstetrics and Gynecology | Admitting: Obstetrics and Gynecology

## 2017-01-15 ENCOUNTER — Other Ambulatory Visit: Payer: Self-pay | Admitting: Obstetrics and Gynecology

## 2017-01-15 DIAGNOSIS — N83201 Unspecified ovarian cyst, right side: Secondary | ICD-10-CM | POA: Insufficient documentation

## 2017-01-15 DIAGNOSIS — F419 Anxiety disorder, unspecified: Secondary | ICD-10-CM | POA: Insufficient documentation

## 2017-01-15 DIAGNOSIS — Z9221 Personal history of antineoplastic chemotherapy: Secondary | ICD-10-CM | POA: Insufficient documentation

## 2017-01-15 DIAGNOSIS — K3580 Unspecified acute appendicitis: Secondary | ICD-10-CM | POA: Diagnosis not present

## 2017-01-15 DIAGNOSIS — Z881 Allergy status to other antibiotic agents status: Secondary | ICD-10-CM | POA: Diagnosis not present

## 2017-01-15 DIAGNOSIS — N83202 Unspecified ovarian cyst, left side: Secondary | ICD-10-CM | POA: Insufficient documentation

## 2017-01-15 DIAGNOSIS — R102 Pelvic and perineal pain: Secondary | ICD-10-CM | POA: Diagnosis present

## 2017-01-15 DIAGNOSIS — N939 Abnormal uterine and vaginal bleeding, unspecified: Principal | ICD-10-CM | POA: Diagnosis present

## 2017-01-15 DIAGNOSIS — Z853 Personal history of malignant neoplasm of breast: Secondary | ICD-10-CM | POA: Diagnosis not present

## 2017-01-15 DIAGNOSIS — D261 Other benign neoplasm of corpus uteri: Secondary | ICD-10-CM | POA: Insufficient documentation

## 2017-01-15 DIAGNOSIS — L918 Other hypertrophic disorders of the skin: Secondary | ICD-10-CM | POA: Diagnosis not present

## 2017-01-15 DIAGNOSIS — Z79899 Other long term (current) drug therapy: Secondary | ICD-10-CM | POA: Insufficient documentation

## 2017-01-15 DIAGNOSIS — F329 Major depressive disorder, single episode, unspecified: Secondary | ICD-10-CM | POA: Insufficient documentation

## 2017-01-15 DIAGNOSIS — N762 Acute vulvitis: Secondary | ICD-10-CM | POA: Insufficient documentation

## 2017-01-15 DIAGNOSIS — N760 Acute vaginitis: Secondary | ICD-10-CM | POA: Diagnosis not present

## 2017-01-15 DIAGNOSIS — Z880 Allergy status to penicillin: Secondary | ICD-10-CM | POA: Diagnosis not present

## 2017-01-15 DIAGNOSIS — Z90710 Acquired absence of both cervix and uterus: Secondary | ICD-10-CM | POA: Diagnosis present

## 2017-01-15 DIAGNOSIS — Z888 Allergy status to other drugs, medicaments and biological substances status: Secondary | ICD-10-CM | POA: Diagnosis not present

## 2017-01-15 HISTORY — PX: LAPAROSCOPIC VAGINAL HYSTERECTOMY WITH SALPINGO OOPHORECTOMY: SHX6681

## 2017-01-15 LAB — PREGNANCY, URINE: Preg Test, Ur: NEGATIVE

## 2017-01-15 LAB — TYPE AND SCREEN
ABO/RH(D): B POS
Antibody Screen: NEGATIVE

## 2017-01-15 SURGERY — HYSTERECTOMY, VAGINAL, LAPAROSCOPY-ASSISTED, WITH SALPINGO-OOPHORECTOMY
Anesthesia: General | Laterality: Bilateral

## 2017-01-15 MED ORDER — MIDAZOLAM HCL 2 MG/2ML IJ SOLN
INTRAMUSCULAR | Status: DC | PRN
  Administered 2017-01-15 (×2): 1 mg via INTRAVENOUS

## 2017-01-15 MED ORDER — HYDROMORPHONE HCL 1 MG/ML IJ SOLN
0.2000 mg | INTRAMUSCULAR | Status: DC | PRN
  Administered 2017-01-15 – 2017-01-16 (×4): 0.6 mg via INTRAVENOUS
  Filled 2017-01-15 (×4): qty 1

## 2017-01-15 MED ORDER — ROCURONIUM BROMIDE 100 MG/10ML IV SOLN
INTRAVENOUS | Status: DC | PRN
  Administered 2017-01-15: 40 mg via INTRAVENOUS
  Administered 2017-01-15: 10 mg via INTRAVENOUS

## 2017-01-15 MED ORDER — HYDROMORPHONE HCL 1 MG/ML IJ SOLN
INTRAMUSCULAR | Status: AC
  Filled 2017-01-15: qty 1

## 2017-01-15 MED ORDER — PROPOFOL 10 MG/ML IV BOLUS
INTRAVENOUS | Status: AC
  Filled 2017-01-15: qty 20

## 2017-01-15 MED ORDER — KETOROLAC TROMETHAMINE 30 MG/ML IJ SOLN: 30.0000 mg | Freq: Four times a day (QID) | INTRAMUSCULAR | Status: DC

## 2017-01-15 MED ORDER — SUGAMMADEX SODIUM 200 MG/2ML IV SOLN
INTRAVENOUS | Status: DC | PRN
  Administered 2017-01-15: 130 mg via INTRAVENOUS

## 2017-01-15 MED ORDER — MIDAZOLAM HCL 2 MG/2ML IJ SOLN
INTRAMUSCULAR | Status: AC
  Filled 2017-01-15: qty 2

## 2017-01-15 MED ORDER — FENTANYL CITRATE (PF) 100 MCG/2ML IJ SOLN
INTRAMUSCULAR | Status: DC | PRN
  Administered 2017-01-15 (×3): 50 ug via INTRAVENOUS
  Administered 2017-01-15: 100 ug via INTRAVENOUS

## 2017-01-15 MED ORDER — OXYCODONE-ACETAMINOPHEN 5-325 MG PO TABS: 1.0000 | ORAL_TABLET | ORAL | Status: DC | PRN

## 2017-01-15 MED ORDER — LACTATED RINGERS IV SOLN
INTRAVENOUS | Status: DC
  Administered 2017-01-15 (×2): via INTRAVENOUS
  Administered 2017-01-15: 125 mL/h via INTRAVENOUS

## 2017-01-15 MED ORDER — SIMETHICONE 80 MG PO CHEW
80.0000 mg | CHEWABLE_TABLET | Freq: Four times a day (QID) | ORAL | Status: DC | PRN
  Administered 2017-01-16: 80 mg via ORAL
  Filled 2017-01-15: qty 1

## 2017-01-15 MED ORDER — SUMATRIPTAN SUCCINATE 50 MG PO TABS
50.0000 mg | ORAL_TABLET | ORAL | Status: DC | PRN
  Filled 2017-01-15: qty 2

## 2017-01-15 MED ORDER — KETOROLAC TROMETHAMINE 30 MG/ML IJ SOLN
30.0000 mg | Freq: Four times a day (QID) | INTRAMUSCULAR | Status: DC
  Administered 2017-01-15 – 2017-01-16 (×3): 30 mg via INTRAVENOUS
  Filled 2017-01-15 (×3): qty 1

## 2017-01-15 MED ORDER — HYDROMORPHONE HCL 1 MG/ML IJ SOLN
INTRAMUSCULAR | Status: DC | PRN
  Administered 2017-01-15 (×2): 1 mg via INTRAVENOUS

## 2017-01-15 MED ORDER — DEXAMETHASONE SODIUM PHOSPHATE 10 MG/ML IJ SOLN
INTRAMUSCULAR | Status: AC
  Filled 2017-01-15: qty 1

## 2017-01-15 MED ORDER — CIPROFLOXACIN IN D5W 400 MG/200ML IV SOLN
400.0000 mg | INTRAVENOUS | Status: AC
  Administered 2017-01-15: 400 mg via INTRAVENOUS
  Filled 2017-01-15: qty 200

## 2017-01-15 MED ORDER — SODIUM CHLORIDE 0.9% FLUSH: 3.0000 mL | INTRAVENOUS | Status: DC | PRN

## 2017-01-15 MED ORDER — ONDANSETRON HCL 4 MG/2ML IJ SOLN
INTRAMUSCULAR | Status: DC | PRN
  Administered 2017-01-15: 4 mg via INTRAVENOUS

## 2017-01-15 MED ORDER — BACITRACIN ZINC 500 UNIT/GM EX OINT
TOPICAL_OINTMENT | CUTANEOUS | Status: AC
  Filled 2017-01-15: qty 28.35

## 2017-01-15 MED ORDER — VASOPRESSIN 20 UNIT/ML IV SOLN
INTRAVENOUS | Status: AC
  Filled 2017-01-15: qty 1

## 2017-01-15 MED ORDER — FENTANYL CITRATE (PF) 100 MCG/2ML IJ SOLN
INTRAMUSCULAR | Status: AC
  Administered 2017-01-15: 50 ug via INTRAVENOUS
  Filled 2017-01-15: qty 2

## 2017-01-15 MED ORDER — TEMAZEPAM 15 MG PO CAPS
15.0000 mg | ORAL_CAPSULE | Freq: Every evening | ORAL | Status: DC | PRN
  Filled 2017-01-15: qty 1

## 2017-01-15 MED ORDER — ALUM & MAG HYDROXIDE-SIMETH 200-200-20 MG/5ML PO SUSP: 30.0000 mL | ORAL | Status: DC | PRN

## 2017-01-15 MED ORDER — SCOPOLAMINE 1 MG/3DAYS TD PT72
MEDICATED_PATCH | TRANSDERMAL | Status: AC
  Administered 2017-01-15: 1.5 mg via TRANSDERMAL
  Filled 2017-01-15: qty 1

## 2017-01-15 MED ORDER — CLINDAMYCIN PHOSPHATE 900 MG/50ML IV SOLN
900.0000 mg | INTRAVENOUS | Status: AC
  Administered 2017-01-15: 900 mg via INTRAVENOUS
  Filled 2017-01-15: qty 50

## 2017-01-15 MED ORDER — ONDANSETRON HCL 4 MG/2ML IJ SOLN
INTRAMUSCULAR | Status: AC
  Filled 2017-01-15: qty 2

## 2017-01-15 MED ORDER — ESCITALOPRAM OXALATE 10 MG PO TABS
10.0000 mg | ORAL_TABLET | Freq: Every day | ORAL | Status: DC
  Administered 2017-01-16: 10 mg via ORAL
  Filled 2017-01-15: qty 1

## 2017-01-15 MED ORDER — ROCURONIUM BROMIDE 100 MG/10ML IV SOLN
INTRAVENOUS | Status: AC
  Filled 2017-01-15: qty 1

## 2017-01-15 MED ORDER — HEMOSTATIC AGENTS (NO CHARGE) OPTIME
TOPICAL | Status: DC | PRN
  Administered 2017-01-15: 1 via TOPICAL

## 2017-01-15 MED ORDER — ACETAMINOPHEN 10 MG/ML IV SOLN
INTRAVENOUS | Status: AC
  Administered 2017-01-15: 1000 mg via INTRAVENOUS
  Filled 2017-01-15: qty 100

## 2017-01-15 MED ORDER — LACTATED RINGERS IV SOLN
INTRAVENOUS | Status: DC
  Administered 2017-01-15: 18:00:00 via INTRAVENOUS

## 2017-01-15 MED ORDER — BUPIVACAINE HCL (PF) 0.25 % IJ SOLN
INTRAMUSCULAR | Status: AC
  Filled 2017-01-15: qty 30

## 2017-01-15 MED ORDER — IBUPROFEN 800 MG PO TABS: 800.0000 mg | ORAL_TABLET | Freq: Three times a day (TID) | ORAL | Status: DC | PRN

## 2017-01-15 MED ORDER — GLYCOPYRROLATE 0.2 MG/ML IJ SOLN
INTRAMUSCULAR | Status: DC | PRN
  Administered 2017-01-15: 0.1 mg via INTRAVENOUS

## 2017-01-15 MED ORDER — PROPOFOL 10 MG/ML IV BOLUS
INTRAVENOUS | Status: DC | PRN
  Administered 2017-01-15: 160 mg via INTRAVENOUS

## 2017-01-15 MED ORDER — BUPIVACAINE HCL (PF) 0.25 % IJ SOLN
INTRAMUSCULAR | Status: DC | PRN
  Administered 2017-01-15: 28 mL
  Administered 2017-01-15: 19 mL

## 2017-01-15 MED ORDER — TEMAZEPAM 15 MG PO CAPS: 15.0000 mg | ORAL_CAPSULE | Freq: Every evening | ORAL | Status: DC | PRN

## 2017-01-15 MED ORDER — ACETAMINOPHEN 10 MG/ML IV SOLN
1000.0000 mg | Freq: Once | INTRAVENOUS | Status: AC
  Administered 2017-01-15: 1000 mg via INTRAVENOUS
  Filled 2017-01-15: qty 100

## 2017-01-15 MED ORDER — LIDOCAINE HCL (CARDIAC) 20 MG/ML IV SOLN
INTRAVENOUS | Status: DC | PRN
  Administered 2017-01-15: 100 mg via INTRAVENOUS

## 2017-01-15 MED ORDER — SUGAMMADEX SODIUM 200 MG/2ML IV SOLN
INTRAVENOUS | Status: AC
  Filled 2017-01-15: qty 2

## 2017-01-15 MED ORDER — SODIUM CHLORIDE 0.9 % IJ SOLN
INTRAMUSCULAR | Status: AC
  Filled 2017-01-15: qty 100

## 2017-01-15 MED ORDER — ONDANSETRON HCL 4 MG/2ML IJ SOLN: 4.0000 mg | Freq: Four times a day (QID) | INTRAMUSCULAR | Status: DC | PRN

## 2017-01-15 MED ORDER — MENTHOL 3 MG MT LOZG: 1.0000 | LOZENGE | OROMUCOSAL | Status: DC | PRN

## 2017-01-15 MED ORDER — FENTANYL CITRATE (PF) 250 MCG/5ML IJ SOLN
INTRAMUSCULAR | Status: AC
  Filled 2017-01-15: qty 5

## 2017-01-15 MED ORDER — FENTANYL CITRATE (PF) 100 MCG/2ML IJ SOLN
25.0000 ug | INTRAMUSCULAR | Status: DC | PRN
  Administered 2017-01-15 (×3): 50 ug via INTRAVENOUS

## 2017-01-15 MED ORDER — LIDOCAINE HCL (CARDIAC) 20 MG/ML IV SOLN
INTRAVENOUS | Status: AC
  Filled 2017-01-15: qty 5

## 2017-01-15 MED ORDER — SCOPOLAMINE 1 MG/3DAYS TD PT72
1.0000 | MEDICATED_PATCH | Freq: Once | TRANSDERMAL | Status: DC
  Administered 2017-01-15: 1.5 mg via TRANSDERMAL

## 2017-01-15 MED ORDER — LIDOCAINE-EPINEPHRINE 1 %-1:100000 IJ SOLN
INTRAMUSCULAR | Status: AC
  Filled 2017-01-15: qty 1

## 2017-01-15 MED ORDER — ONDANSETRON HCL 4 MG PO TABS: 4.0000 mg | ORAL_TABLET | Freq: Four times a day (QID) | ORAL | Status: DC | PRN

## 2017-01-15 SURGICAL SUPPLY — 49 items
APPLICATOR ARISTA FLEXITIP XL (MISCELLANEOUS) ×3 IMPLANT
CATH ROBINSON RED A/P 16FR (CATHETERS) IMPLANT
CONT PATH 16OZ SNAP LID 3702 (MISCELLANEOUS) ×3 IMPLANT
COVER BACK TABLE 60X90IN (DRAPES) ×3 IMPLANT
COVER MAYO STAND STRL (DRAPES) ×3 IMPLANT
DECANTER SPIKE VIAL GLASS SM (MISCELLANEOUS) IMPLANT
DERMABOND ADVANCED (GAUZE/BANDAGES/DRESSINGS) ×2
DERMABOND ADVANCED .7 DNX12 (GAUZE/BANDAGES/DRESSINGS) ×1 IMPLANT
DILATOR CANAL MILEX (MISCELLANEOUS) ×3 IMPLANT
DRSG OPSITE 4X5.5 SM (GAUZE/BANDAGES/DRESSINGS) ×3 IMPLANT
DRSG OPSITE POSTOP 3X4 (GAUZE/BANDAGES/DRESSINGS) IMPLANT
DURAPREP 26ML APPLICATOR (WOUND CARE) ×3 IMPLANT
ELECT REM PT RETURN 9FT ADLT (ELECTROSURGICAL)
ELECTRODE REM PT RTRN 9FT ADLT (ELECTROSURGICAL) IMPLANT
GLOVE BIOGEL M 6.5 STRL (GLOVE) ×6 IMPLANT
GLOVE BIOGEL PI IND STRL 6.5 (GLOVE) ×2 IMPLANT
GLOVE BIOGEL PI IND STRL 7.0 (GLOVE) ×3 IMPLANT
GLOVE BIOGEL PI INDICATOR 6.5 (GLOVE) ×4
GLOVE BIOGEL PI INDICATOR 7.0 (GLOVE) ×6
HEMOSTAT ARISTA ABSORB 3G PWDR (MISCELLANEOUS) ×6 IMPLANT
LEGGING LITHOTOMY PAIR STRL (DRAPES) ×3 IMPLANT
LIGASURE IMPACT 36 18CM CVD LR (INSTRUMENTS) IMPLANT
NEEDLE SPNL 22GX3.5 QUINCKE BK (NEEDLE) ×3 IMPLANT
NS IRRIG 1000ML POUR BTL (IV SOLUTION) ×3 IMPLANT
PACK LAVH (CUSTOM PROCEDURE TRAY) ×3 IMPLANT
PACK ROBOTIC GOWN (GOWN DISPOSABLE) ×3 IMPLANT
PACK TRENDGUARD 450 HYBRID PRO (MISCELLANEOUS) IMPLANT
PACK TRENDGUARD 600 HYBRD PROC (MISCELLANEOUS) IMPLANT
POUCH LAPAROSCOPIC INSTRUMENT (MISCELLANEOUS) ×3 IMPLANT
PROTECTOR NERVE ULNAR (MISCELLANEOUS) ×6 IMPLANT
SET IRRIG TUBING LAPAROSCOPIC (IRRIGATION / IRRIGATOR) IMPLANT
SHEARS HARMONIC ACE PLUS 36CM (ENDOMECHANICALS) IMPLANT
SLEEVE XCEL OPT CAN 5 100 (ENDOMECHANICALS) ×6 IMPLANT
SOLUTION ELECTROLUBE (MISCELLANEOUS) IMPLANT
SUT VIC AB 0 CT1 27 (SUTURE) ×4
SUT VIC AB 0 CT1 27XCR 8 STRN (SUTURE) ×2 IMPLANT
SUT VIC AB 0 CT1 36 (SUTURE) ×6 IMPLANT
SUT VIC AB 0 CTX 36 (SUTURE)
SUT VIC AB 0 CTX36XBRD ANBCTRL (SUTURE) IMPLANT
SUT VIC AB 3-0 SH 27 (SUTURE) ×2
SUT VIC AB 3-0 SH 27X BRD (SUTURE) ×1 IMPLANT
SUT VIC AB 4-0 PS2 27 (SUTURE) IMPLANT
SUT VICRYL 1 TIES 12X18 (SUTURE) ×3 IMPLANT
TOWEL OR 17X24 6PK STRL BLUE (TOWEL DISPOSABLE) ×6 IMPLANT
TRAY FOLEY CATH SILVER 14FR (SET/KITS/TRAYS/PACK) ×3 IMPLANT
TRENDGUARD 450 HYBRID PRO PACK (MISCELLANEOUS)
TRENDGUARD 600 HYBRID PROC PK (MISCELLANEOUS)
TROCAR XCEL NON-BLD 5MMX100MML (ENDOMECHANICALS) ×3 IMPLANT
WARMER LAPAROSCOPE (MISCELLANEOUS) ×3 IMPLANT

## 2017-01-15 NOTE — Anesthesia Procedure Notes (Signed)
Procedure Name: Intubation Date/Time: 01/15/2017 11:23 AM Performed by: Flossie Dibble, CRNA Pre-anesthesia Checklist: Patient identified, Patient being monitored, Timeout performed, Emergency Drugs available and Suction available Patient Re-evaluated:Patient Re-evaluated prior to induction Oxygen Delivery Method: Circle System Utilized Preoxygenation: Pre-oxygenation with 100% oxygen Induction Type: IV induction Ventilation: Mask ventilation without difficulty Laryngoscope Size: Mac and 3 Grade View: Grade I Tube type: Oral Tube size: 7.0 mm Number of attempts: 1 Airway Equipment and Method: stylet Placement Confirmation: ETT inserted through vocal cords under direct vision,  positive ETCO2 and breath sounds checked- equal and bilateral Secured at: 21 cm Tube secured with: Tape Dental Injury: Teeth and Oropharynx as per pre-operative assessment

## 2017-01-15 NOTE — Progress Notes (Addendum)
1947: Patient seen awake in bed, c/o surgical pain  But "getting better" heat applied. We will continue to monitor.  2047: Abdominal pain "feels better, the heat is soothing" We will continue to monitor.  2245: C/O surgical pain left abdomin 5/10 Dilaudid 0.6 mg administered. We will continue to monitor.  2248: Foley cath removed, all parts accounted for. Patient tolerated procedure well. Ambulated to the bathroom, peri care done.  2245: Pt seen ambulating along the hallway.  0245: Ambulated along the hall way.  0248: C/O surgical pain 5/10 or 6/10, Dilaudid 0.6 mg administered. We will continue to monitor.

## 2017-01-15 NOTE — Anesthesia Preprocedure Evaluation (Signed)
Anesthesia Evaluation  Patient identified by MRN, date of birth, ID band Patient awake    Reviewed: Allergy & Precautions, H&P , Patient's Chart, lab work & pertinent test results, reviewed documented beta blocker date and time   Airway Mallampati: II  TM Distance: >3 FB Neck ROM: full    Dental no notable dental hx.    Pulmonary    Pulmonary exam normal breath sounds clear to auscultation       Cardiovascular  Rhythm:regular Rate:Normal     Neuro/Psych    GI/Hepatic   Endo/Other    Renal/GU      Musculoskeletal   Abdominal   Peds  Hematology   Anesthesia Other Findings   Reproductive/Obstetrics                             Anesthesia Physical Anesthesia Plan  ASA: II  Anesthesia Plan: General   Post-op Pain Management:    Induction: Intravenous  PONV Risk Score and Plan: 2 and Ondansetron and Scopolamine patch - Pre-op  Airway Management Planned: Oral ETT  Additional Equipment:   Intra-op Plan:   Post-operative Plan: Extubation in OR  Informed Consent: I have reviewed the patients History and Physical, chart, labs and discussed the procedure including the risks, benefits and alternatives for the proposed anesthesia with the patient or authorized representative who has indicated his/her understanding and acceptance.   Dental Advisory Given  Plan Discussed with: CRNA and Surgeon  Anesthesia Plan Comments: (  )        Anesthesia Quick Evaluation

## 2017-01-15 NOTE — Op Note (Signed)
01/15/2017  2:00 PM  PATIENT:  Kelli Bush  46 y.o. female  PRE-OPERATIVE DIAGNOSIS:  N93.9 AUB  POST-OPERATIVE DIAGNOSIS:  Abnormal Uterine Bleeding  PROCEDURE:  Procedure(s): LAPAROSCOPIC ASSISTED VAGINAL HYSTERECTOMY WITH BILATERAL SALIPINGO- OOPHORECTOMY (Bilateral)  SURGEON:  Surgeon(s) and Role:    Christophe Louis, MD - Primary    * Janyth Pupa, DO - Assisting  PHYSICIAN ASSISTANT:   ASSISTANTS: Dr Janyth Pupa assisted due to the complexity of the surgery and concern for adhesive disease    ANESTHESIA:   general  EBL:  200 mL   BLOOD ADMINISTERED:none  DRAINS: Urinary Catheter (Foley)   LOCAL MEDICATIONS USED:  MARCAINE    and LIDOCAINE   SPECIMEN:  Source of Specimen:  uterus / cervix / bilateral fallopian tubes and ovaries.   DISPOSITION OF SPECIMEN:  PATHOLOGY  COUNTS:  YES  TOURNIQUET:  * No tourniquets in log *  DICTATION: .Dragon Dictation  PLAN OF CARE: Admit for overnight observation  PATIENT DISPOSITION:  PACU - hemodynamically stable.   Delay start of Pharmacological VTE agent (>24hrs) due to surgical blood loss or risk of bleeding: not applicable   Findings: Normal uterus Normal appearing left fallopian tube and ovariy. The right fallopian tube appeared normal. The right ovary appeared to obtain a small hemorrhagic cyst.   Procedure: the patient was taken to the operating room placed under general anesthesia. Prepped and draped in the normal sterile fashion. A foley catheter was placed. A uterine manipulator was placed. Attention was turned to the abdomen where the umbilicus was injected with 10 cc of marcaine. A 5 mm trocar was placed under direct visualization. Pneumoperitoneum was achieved with C02 gas... A 5 mm trocar was placed in the right and left lower quadrants. Each trocar site was injected with 10 cc of marcaine prior to trocar placement.. The left infundibulopelvic ligament was cauterized and transected with the harmonic scalpel.  Followed by the broad ligament and the round ligament. This was repeated on the right side.  Attention was then turned to the vagina. A weighted speculum was placed in to the vagina and the cervix was grasped with a toothed tenaculum. The cervix was then injected circumferentially with 1% xylocaine with 1:100K of epinephrine. The cervix was then circumferentially incised with the scalpel and the bladder was dissected off the pubovesical cervical fascia. The anterior cul-de-sac as entered sharply. The same procedure was performed posteriorly and the posterior cu-lde-sac was entered sharply without difficulty. A Heaney clamp was placed over the uterosacral ligaments bilaterally., These were transected and suture ligated with 0 vicryl. The cardinal ligaments were then grasped with the ligasure cauterized and transected.  The uterine arteries Then clamped with ligasure cauterized and , transected .Excellent hemostasis was visualized. The uterus cervix bilateral fallopian tubes and ovaries were then removed.   The vaginal cuff angles were closed with an angle suture of 0 vicryl and transfixed to the ipsilateral uterosacral ligaments. The remainder of the vaginal cuff was closed with 0 vicryl in a running locked fashion. All instruments were removed from the vagina. Attention was turned to the abdomen were pneumoperitoneum was reestablished. The pelvis was irrigated. Bleeding was noted from the left pelvic side wall. Hemostasis was obtained with Kleppenger. Slight oozing noted from cuff. Arista was applied. . Excellent hemostasis was noted. All trocars were removed under direct visualization . The pneumoperitoneum was released. The skin incisions were closed with dermabond.   the patient was taken to the recovery room awake and in stable condition.  Sponge lap and needle counts were correct times 2.

## 2017-01-15 NOTE — H&P (Signed)
Chief Complaint(s):   abnormal uterine bleeding/ Thickened endometrium   HPI:  General 46 y/o presents for preop history and physcial exam in prepaparation for laparoscopic assisted vaginal hysterectomy with bilateral salpingectomy to manage abnormal uterine bleeding.  she has a h/o breast cancer 02/06/2011 in the right breast. she had a double mastectomy 2013. she had chemotherapy reconstruction and radiation. she has been on tamoxifen since 2013. Her oncology is Ernesto Rutherford in [place]. With [facility] in [place]. she reports being perimenopausal.  she had genetic testing that was negative.  she reports bleeding every other week for 3 days for the last 2 months. it is very light. it saturates a pantiliner. she is wearing regular tampons. it takes over 6-8 hours to saturate a panti liner. Her last normal menses was over 5 years ago. prior to chemotherapy.  she underwent a laparoscopic appendectomy for appendicitis on 11/19/2016. Her CT scan 11/17/2016 revealed a diffuse soft tissue heterogeneity within the uterus measuring 4.8 cm. ? fibroid however endomerial malignancy cannot be excluded. Pelvic ultrasound was recommended.  Ultrasound 12/18/2016 show 9 cm x 7 cm x 6 cm uterus. Endometrium 3.6 cm. Endometrial biopsy performed 12/18/2016 was benign.  Current Medication:  Taking  Klonopin(ClonazePAM) 1 MG Tablet 1/2 tablet Orally as needed     Imitrex(SUMAtriptan) 100 MG Tablet 1/2 tablet Orally as needed     Temazepam 30 MG Capsule 1/2 tablet Orally as needed     Vitamin D 2000 UNIT Tablet 1 tablet Orally Once a day     Melatonin . Tablet 1 tablet at bedtime as needed with food Orally Once a day     Lexapro(Escitalopram Oxalate) . Tablet 1 tablet Orally Once a day     Tamoxifen Citrate . Tablet 1 tablet Orally Once a day   Not-Taking  Lysteda 650 MG Tablet 2 tablets Orally Three times daily     Medication List reviewed and reconciled with the patient   Medical History:   Breast CA     anxiety      depression      Allergies/Intolerance:   Penicillin - rash     Dexamethasone - psychosis   Gyn History:   Sexual activity currently sexually active. LMP 5 years ago . Last pap smear date 12/08/2016. Last mammogram date 5 years ago. Denies STD.   OB History:   Number of pregnancies 1. Pregnancy # 1 live birth, vaginal delivery, boy.   Surgical History:   double mastectomy     appendectomy 11/2016     left hand surgery     arthoroscopic repair left torn meniscus   Hospitalization:   appendectomy 11/2016   Family History:   Father: alive    Mother: deceased    1 sister(s) . 1 son(s) .    denies any GYN family cancer hx.  Social History:  General Tobacco use cigarettes: Former smoker, Quit in year 6 years ago, Tobacco history last updated 12/30/2016.  Alcohol: yes, occasionally.  no Recreational drug use.  Marital Status: married, Legrand Como.  Children: 1, son .  OCCUPATION: employed, work from home - [employer] .  ROS: CONSTITUTIONAL No" options="no,yes" propid="91" itemid="193425" categoryid="10464" encounterid="[id number]"Chills No. No" options="no,yes" propid="91" itemid="172899" categoryid="10464" encounterid="[id number]"Fatigue No. No" options="no,yes" propid="91" itemid="10467" categoryid="10464" encounterid="[id number]"Fever No. No" options="no,yes" propid="91" itemid="193426" categoryid="10464" encounterid="[id number]"Night sweats No. No" options="no,yes" propid="91" itemid="444261" categoryid="10464" encounterid="[id number]"Recent travel outside [place] No. No" options="no,yes" propid="91" itemid="193427" categoryid="10464" encounterid="[id number]"Sweats No. No" options="no,yes" propid="91" itemid="194825" categoryid="10464" encounterid="[id number]"Weight change No.  OPHTHALMOLOGY no" options="no,yes" propid="91" itemid="12520" categoryid="12516" encounterid="[id number]"Blurring of  vision no. no" options="no,yes" propid="91" itemid="193469" categoryid="12516" encounterid="[id number]"Change  in vision no. no" options="no,yes" propid="91" itemid="194379" categoryid="12516" encounterid="[id number]"Double vision no.  ENT no" options="no,yes" propid="91" itemid="193612" categoryid="10481" encounterid="[id number]"Dizziness no. Nose bleeds no. Sore throat no. Teeth pain no.  ALLERGY no" options="no,yes" propid="91" itemid="202589" categoryid="138152" encounterid="[id number]"Hives no.  CARDIOLOGY no" options="no,yes" propid="91" itemid="193603" categoryid="10488" encounterid="[id number]"Chest pain no. no" options="no,yes" propid="91" itemid="199089" categoryid="10488" encounterid="[id number]"High blood pressure no. no" options="no,yes" propid="91" itemid="202598" categoryid="10488" encounterid="[id number]"Irregular heart beat no. no" options="no,yes" propid="91" itemid="10491" categoryid="10488" encounterid="[id number]"Leg edema no. no" options="no,yes" propid="91" itemid="10490" categoryid="10488" encounterid="[id number]"Palpitations no.  RESPIRATORY no" options="no" propid="91" itemid="270013" categoryid="138132" encounterid="[id number]"Shortness of breath no. no" options="no,yes" propid="91" itemid="172745" categoryid="138132" encounterid="[id number]"Cough no. no" options="no,yes" propid="91" itemid="193621" categoryid="138132" encounterid="[id number]"Wheezing no.  UROLOGY no" options="no,yes" propid="91" itemid="194377" categoryid="138166" encounterid="[id number]"Pain with urination no. no" options="no,yes" propid="91" itemid="193493" categoryid="138166" encounterid="[id number]"Urinary urgency no. no" options="no,yes" propid="91" itemid="193492" categoryid="138166" encounterid="[id number]"Urinary frequency no. no" options="no,yes" propid="91" itemid="138171" categoryid="138166" encounterid="[id number]"Urinary incontinence no. No" options="no,yes" propid="91" itemid="138167" categoryid="138166" encounterid="[id number]"Difficulty urinating No. No" options="no,yes" propid="91" itemid="138168" categoryid="138166" encounterid="[id number]"Blood in  urine No.  GASTROENTEROLOGY no" options="no,yes" propid="91" itemid="10496" categoryid="10494" encounterid="[id number]"Abdominal pain no. no" options="no,yes" propid="91" itemid="193447" categoryid="10494" encounterid="[id number]"Appetite change no. no" options="no,yes" propid="91" itemid="193448" categoryid="10494" encounterid="[id number]"Bloating/belching no. no" options="no,yes" propid="91" itemid="10503" categoryid="10494" encounterid="[id number]"Blood in stool or on toilet paper no. no" options="no,yes" propid="91" itemid="199106" categoryid="10494" encounterid="[id number]"Change in bowel movements no. no" options="no,yes" propid="91" itemid="10501" categoryid="10494" encounterid="[id number]"Constipation no. no" options="no,yes" propid="91" itemid="10502" categoryid="10494" encounterid="[id number]"Diarrhea no. no" options="no,yes" propid="91" itemid="199104" categoryid="10494" encounterid="[id number]"Difficulty swallowing no. no" options="no,yes" propid="91" itemid="10499" categoryid="10494" encounterid="[id number]"Nausea no.  FEMALE REPRODUCTIVE no" options="no,yes" propid="91" itemid="453725" categoryid="10525" encounterid="[id number]"Vulvar pain no. pruritus" options="no,yes" propid="91" itemid="453726" categoryid="10525" encounterid="[id number]"Vulvar rash pruritus. yes" options="no, yes" propid="91" itemid="444315" categoryid="10525" encounterid="[id number]"Abnormal vaginal bleeding yes. no" options="no,yes" propid="91" itemid="186083" categoryid="10525" encounterid="[id number]"Breast pain no. no" options="no,yes" propid="91" itemid="186084" categoryid="10525" encounterid="[id number]"Nipple discharge no. no" options="no,yes" propid="91" itemid="275823" categoryid="10525" encounterid="[id number]"Pain with intercourse no. no" options="no,yes" propid="91" itemid="186082" categoryid="10525" encounterid="[id number]"Pelvic pain no. no" options="no,yes" propid="91" itemid="278230" categoryid="10525" encounterid="[id number]"Unusual vaginal discharge no. no"  options="no,yes" propid="91" itemid="278942" categoryid="10525" encounterid="[id number]"Vaginal itching no.  MUSCULOSKELETAL no" options="no,yes" propid="91" itemid="193461" categoryid="10514" encounterid="[id number]"Muscle aches no.  NEUROLOGY no" options="no,yes" propid="91" itemid="12513" categoryid="12512" encounterid="[id number]"Headache no. no" options="no,yes" propid="91" itemid="12514" categoryid="12512" encounterid="[id number]"Tingling/numbness no. no" options="no,yes" propid="91" itemid="193468" categoryid="12512" encounterid="[id number]"Weakness no.  PSYCHOLOGY no" options="" propid="91" itemid="275919" categoryid="10520" encounterid="[id number]"Depression no. no" options="no,yes" propid="91" itemid="172748" categoryid="10520" encounterid="[id number]"Anxiety no. no" options="no,yes" propid="91" itemid="199158" categoryid="10520" encounterid="[id number]"Nervousness no. no" options="no,yes" propid="91" itemid="12502" categoryid="10520" encounterid="[id number]"Sleep disturbances no. no " options="no,yes" propid="91" itemid="72718" categoryid="10520" encounterid="[id number]"Suicidal ideation no .  ENDOCRINOLOGY no" options="no,yes" propid="91" itemid="194628" categoryid="12508" encounterid="[id number]"Excessive thirst no. no" options="no,yes" propid="91" itemid="196285" categoryid="12508" encounterid="[id number]"Excessive urination no. no" options="no, yes" propid="91" itemid="444314" categoryid="12508" encounterid="[id number]"Hair loss no. no" options="" propid="91" itemid="447284" categoryid="12508" encounterid="[id number]"Heat or cold intolerance no.  HEMATOLOGY/LYMPH no" options="no,yes" propid="91" itemid="199152" categoryid="138157" encounterid="[id number]"Abnormal bleeding no. no" options="no,yes" propid="91" itemid="170653" categoryid="138157" encounterid="[id number]"Easy bruising no. no" options="no,yes" propid="91" itemid="138158" categoryid="138157" encounterid="[id number]"Swollen glands no.  DERMATOLOGY no" options="no,yes" propid="91"  itemid="199126" categoryid="12503" encounterid="[id number]"New/changing skin lesion no. no" options="no,yes" propid="91" itemid="12504" categoryid="12503" encounterid="[id number]"Rash no. no" options="" propid="91" itemid="444313" categoryid="12503" encounterid="[id number]"Sores no.  Negative except as stated in HPI.   Objective: Vitals:  Wt 153, Wt change -2 lb, Ht 6'0, BMI 20.75, Pulse sitting 72, BP sitting 108/60  Past Results:  Examination:  General Examination alert, oriented, NAD " categoryPropId="10089" examid="193638"GENERAL APPEARANCE alert, oriented, NAD .  moist, warm " categoryPropId="10109" examid="193638"SKIN: moist, warm .  Conjunctiva clear " categoryPropId="21468" examid="193638"EYES: Conjunctiva clear .  clear to auscultation bilaterally" categoryPropId="87" examid="193638"LUNGS: clear to auscultation bilaterally.  regular rate and rhythm" categoryPropId="86" examid="193638"HEART: regular rate and rhythm.  soft, non-tender/non-distended, bowel sounds present " categoryPropId="88" examid="193638"ABDOMEN: soft, non-tender/non-distended, bowel sounds present .  normal external genitalia, labia - unremarkable, vagina - pink  moist mucosa, no lesions or abnormal discharge, cervix - no discharge or lesions or CMT, adnexa - no masses or tenderness, uterus - nontender and normal size on palpation " categoryPropId="13414" examid="193638"FEMALE GENITOURINARY: normal external genitalia, labia - unremarkable, vagina - pink moist mucosa, no lesions or abnormal discharge, cervix - no discharge or lesions or CMT, adnexa - no masses or tenderness, uterus - nontender and normal size on palpation .  affect normal, good eye contact " categoryPropId="16316" examid="193638"PSYCH: affect normal, good eye contact .  Physical Examination:   Assessment: Assessment:  Abnormal uterine bleeding - N93.9 (Primary)     Acute vulvitis - N76.2     Vulvovaginitis - N76.0     Thickened endometrium - R93.89      Personal history of breast cancer - Z85.3     Plan: Treatment:  Abnormal uterine bleeding  Notes: pt scheduled for laparoscopic assisted vaginal hyserectomy with bilateral salpingoophrectomy r/b/a of surgery discussed with patient . including but not limited to infection, bleeding damage to bowel bladder ureters with the need for further surgery. r/o transfusion discussed. . If carcinoma is noted on the surgical specimen she may require further surgery. she voiced understanding and desires to proceed.  Acute vulvitis  Start Betamethasone Dipropionate Cream, 1.22 %, 1 application to affected area, Externally, Twice a day, 7 days, 30 Gram, Refills 0  Vulvovaginitis  Lab:[facility]  Thickened endometrium  Notes: pt scheduled for laparoscopic assisted vaginal hyserectomy with bilateral salpingoophrectomy r/b/a of surgery discussed with patient . including but not limited to infection, bleeding damage to bowel bladder ureters with the need for further surgery. r/o transfusion discussed. . If carcinoma is noted on the surgical specimen she may require further surgery. she voiced understanding and desires to proceed.

## 2017-01-15 NOTE — H&P (Deleted)
  The note originally documented on this encounter has been moved the the encounter in which it belongs.  

## 2017-01-15 NOTE — Transfer of Care (Signed)
Immediate Anesthesia Transfer of Care Note  Patient: Kelli Bush  Procedure(s) Performed: LAPAROSCOPIC ASSISTED VAGINAL HYSTERECTOMY WITH BILATERAL SALIPINGO- OOPHORECTOMY (Bilateral )  Patient Location: PACU  Anesthesia Type:General  Level of Consciousness: awake, alert  and oriented  Airway & Oxygen Therapy: Patient Spontanous Breathing and Patient connected to nasal cannula oxygen  Post-op Assessment: Report given to RN and Post -op Vital signs reviewed and stable  Post vital signs: Reviewed and stable  Last Vitals:  Vitals:   01/15/17 1010 01/15/17 1316  BP: 116/67 112/61  Pulse: 67 74  Resp: 16 16  Temp: 36.7 C 36.5 C  SpO2: 98% 99%    Last Pain:  Vitals:   01/15/17 1010  TempSrc: Oral  PainSc: 2       Patients Stated Pain Goal: 3 (57/47/34 0370)  Complications: No apparent anesthesia complications

## 2017-01-16 DIAGNOSIS — R102 Pelvic and perineal pain: Secondary | ICD-10-CM | POA: Diagnosis present

## 2017-01-16 DIAGNOSIS — N939 Abnormal uterine and vaginal bleeding, unspecified: Secondary | ICD-10-CM | POA: Diagnosis present

## 2017-01-16 LAB — CBC
HEMATOCRIT: 32 % — AB (ref 36.0–46.0)
HEMOGLOBIN: 10.8 g/dL — AB (ref 12.0–15.0)
MCH: 32.9 pg (ref 26.0–34.0)
MCHC: 33.8 g/dL (ref 30.0–36.0)
MCV: 97.6 fL (ref 78.0–100.0)
Platelets: 169 10*3/uL (ref 150–400)
RBC: 3.28 MIL/uL — AB (ref 3.87–5.11)
RDW: 12.7 % (ref 11.5–15.5)
WBC: 6.8 10*3/uL (ref 4.0–10.5)

## 2017-01-16 MED ORDER — IBUPROFEN 800 MG PO TABS: 800.0000 mg | ORAL_TABLET | Freq: Three times a day (TID) | ORAL | 1 refills | Status: AC | PRN

## 2017-01-16 MED ORDER — HYDROMORPHONE HCL 2 MG PO TABS
2.0000 mg | ORAL_TABLET | Freq: Once | ORAL | Status: AC
  Administered 2017-01-16: 2 mg via ORAL
  Filled 2017-01-16: qty 1

## 2017-01-16 MED ORDER — HYDROMORPHONE HCL 2 MG PO TABS: 2.0000 mg | ORAL_TABLET | Freq: Four times a day (QID) | ORAL | 0 refills | Status: AC | PRN

## 2017-01-16 NOTE — Progress Notes (Signed)
Discharge instructions reviewed with patient.  Patient states understanding of home care, medications, activity, signs/symptoms to report to MD and return MD office visit.  Patients significant other and family will assist with her care @ home.  No home  equipment needed, patient has prescriptions and all personal belongings.  Patient discharged via wheelchair in stable condition with staff without incident.  

## 2017-01-16 NOTE — Discharge Summary (Signed)
Physician Discharge Summary  Patient ID: Kelli Bush MRN: [id number] DOB/AGE: 04/25/1970 46 y.o.  Admit date: 01/15/2017 Discharge date: 01/16/2017  Admission Diagnoses:  Discharge Diagnoses:  Active Problems:   S/P hysterectomy   Pelvic pain   Abnormal uterine bleeding   Discharged Condition: stable  Hospital Course: Pt was admitted for observation after undergoing laparoscopic assisted vaginal hysterectomy with bilateral salpingoophorectomy. She did well postoperatively with return of bowel and bladder function. Pt has pain in the left lower quadrant that she had prior to surgery and is most likely GI related. Her pain is controlled with Ibuprofen and dilaudid.   Consults: None  Significant Diagnostic Studies: labs: Hgb 10.8 on pod #1  Treatments: surgery: laparoscopic assisted vaginal hysterectomy with bilateral salpingoophorectomy.  Discharge Exam: Blood pressure 102/61, pulse 71, temperature 98.3 F (36.8 C), temperature source Oral, resp. rate 18, height 6' 0.01" (1.829 m), SpO2 97 %. General appearance: alert, cooperative and no distress Resp: No distress GI: normal findings: soft appropriately tender. Nondistended. +BS in all 4 quadrants.  Pelvic: Peripad with small amount of spotting less than 5 mm.  Extremities: extremities normal, atraumatic, no cyanosis or edema Incision/Wound:clean dry and intact   Disposition: 01-Home or Self Care  Discharge Instructions    Call MD for:  persistant nausea and vomiting   Complete by:  As directed    Call MD for:  redness, tenderness, or signs of infection (pain, swelling, redness, odor or green/yellow discharge around incision site)   Complete by:  As directed    Call MD for:  severe uncontrolled pain   Complete by:  As directed    Call MD for:  temperature >100.4   Complete by:  As directed    Diet - low sodium heart healthy   Complete by:  As directed    Driving Restrictions   Complete by:  As directed    Avoid  driving for 2 weeks   Increase activity slowly   Complete by:  As directed    Lifting restrictions   Complete by:  As directed    Avoid lifting over 10 lbs   No dressing needed   Complete by:  As directed    Other Restrictions   Complete by:  As directed    Avoid tampons   Sexual Activity Restrictions   Complete by:  As directed    Avoid sex until approved by Dr. Landry Mellow     Allergies as of 01/16/2017      Reactions   Dexamethasone    Penicillins Hives, Other (See Comments)   Has patient had a PCN reaction causing immediate rash, facial/tongue/throat swelling, SOB or lightheadedness with hypotension: No Has patient had a PCN reaction causing severe rash involving mucus membranes or skin necrosis: No Has patient had a PCN reaction that required hospitalization: No Has patient had a PCN reaction occurring within the last 10 years: No If all of the above answers are "NO", then may proceed with Cephalosporin use.   Prednisone    Tetracyclines & Related Other (See Comments)   Made pt sick      Medication List    STOP taking these medications   oxyCODONE 5 MG immediate release tablet Commonly known as:  Oxy IR/ROXICODONE     TAKE these medications   acetaminophen 325 MG tablet Commonly known as:  TYLENOL Take 1.5 tablets (487.5 mg total) by mouth every 6 (six) hours.   escitalopram 10 MG tablet Commonly known as:  LEXAPRO Take 10 mg daily  by mouth.   HYDROmorphone 2 MG tablet Commonly known as:  DILAUDID Take 1 tablet (2 mg total) by mouth every 6 (six) hours as needed for up to 7 days for severe pain.   ibuprofen 800 MG tablet Commonly known as:  ADVIL,MOTRIN Take 1 tablet (800 mg total) by mouth every 8 (eight) hours as needed (mild pain). What changed:    when to take this  reasons to take this   Magnesium 250 MG Tabs Take 500 mg daily by mouth.   methocarbamol 500 MG tablet Commonly known as:  ROBAXIN Take 1 tablet (500 mg total) by mouth 3 (three) times  daily.   multivitamin with minerals Tabs tablet Take 1 tablet by mouth daily.   SUMAtriptan 100 MG tablet Commonly known as:  IMITREX Take 50-100 mg every 2 (two) hours as needed by mouth for migraine or headache.   tamoxifen 20 MG tablet Commonly known as:  NOLVADEX Take 20 mg by mouth daily.   temazepam 30 MG capsule Commonly known as:  RESTORIL Take 30 mg at bedtime as needed by mouth for sleep.   Vitamin D3 2000 units capsule Take 2,000 Units by mouth daily.      Follow-up Information    Christophe Louis, MD Follow up in 2 week(s).   Specialty:  Obstetrics and Gynecology Why:  patient should already have an appointment.  Contact information: [address] [address] [phone]           Signed: Catha Brow. 01/16/2017, 9:51 AM

## 2017-01-16 NOTE — Anesthesia Postprocedure Evaluation (Signed)
Anesthesia Post Note  Patient: Kelli Bush  Procedure(s) Performed: LAPAROSCOPIC ASSISTED VAGINAL HYSTERECTOMY WITH BILATERAL SALIPINGO- OOPHORECTOMY (Bilateral )     Patient location during evaluation: [facility] Anesthesia Type: General Level of consciousness: awake and alert and oriented Pain management: satisfactory to patient Vital Signs Assessment: post-procedure vital signs reviewed and stable Respiratory status: spontaneous breathing and respiratory function stable Cardiovascular status: stable Postop Assessment: adequate PO intake Anesthetic complications: no    Last Vitals:  Vitals:   01/16/17 0005 01/16/17 0510  BP: (!) 99/53 (!) 98/58  Pulse: 67 64  Resp: 17 18  Temp: 36.9 C 37.1 C  SpO2: 98% 95%    Last Pain:  Vitals:   01/16/17 0619  TempSrc:   PainSc: 4    Pain Goal: Patients Stated Pain Goal: 3 (01/16/17 8341)               Katherina Mires

## 2017-01-16 NOTE — Plan of Care (Signed)
Pt is afebrile and remain free of injury.

## 2017-01-17 ENCOUNTER — Encounter (HOSPITAL_COMMUNITY): Payer: Self-pay | Admitting: Obstetrics and Gynecology

## 2017-03-04 DIAGNOSIS — Z170 Estrogen receptor positive status [ER+]: Secondary | ICD-10-CM | POA: Diagnosis not present

## 2017-03-04 DIAGNOSIS — C50411 Malignant neoplasm of upper-outer quadrant of right female breast: Secondary | ICD-10-CM | POA: Diagnosis not present

## 2017-03-04 DIAGNOSIS — Z6822 Body mass index (BMI) 22.0-22.9, adult: Secondary | ICD-10-CM | POA: Diagnosis not present

## 2017-03-08 ENCOUNTER — Encounter (HOSPITAL_COMMUNITY): Payer: Self-pay | Admitting: Emergency Medicine

## 2017-03-08 ENCOUNTER — Ambulatory Visit (HOSPITAL_COMMUNITY)
Admission: EM | Admit: 2017-03-08 | Discharge: 2017-03-08 | Disposition: A | Discharge status: Home / Self Care | Payer: 59 | Attending: Family Medicine | Admitting: Family Medicine

## 2017-03-08 ENCOUNTER — Ambulatory Visit (INDEPENDENT_AMBULATORY_CARE_PROVIDER_SITE_OTHER): Payer: 59

## 2017-03-08 DIAGNOSIS — R110 Nausea: Secondary | ICD-10-CM

## 2017-03-08 DIAGNOSIS — R079 Chest pain, unspecified: Secondary | ICD-10-CM | POA: Diagnosis not present

## 2017-03-08 DIAGNOSIS — R0789 Other chest pain: Secondary | ICD-10-CM | POA: Diagnosis not present

## 2017-03-08 DIAGNOSIS — T887XXA Unspecified adverse effect of drug or medicament, initial encounter: Secondary | ICD-10-CM

## 2017-03-08 LAB — POCT I-STAT, CHEM 8
BUN: 17 mg/dL (ref 6–20)
CALCIUM ION: 1.19 mmol/L (ref 1.15–1.40)
CHLORIDE: 105 mmol/L (ref 101–111)
CREATININE: 0.8 mg/dL (ref 0.44–1.00)
GLUCOSE: 87 mg/dL (ref 65–99)
HCT: 43 % (ref 36.0–46.0)
Hemoglobin: 14.6 g/dL (ref 12.0–15.0)
Potassium: 3.6 mmol/L (ref 3.5–5.1)
Sodium: 143 mmol/L (ref 135–145)
TCO2: 26 mmol/L (ref 22–32)

## 2017-03-08 MED ORDER — OMEPRAZOLE 20 MG PO CPDR: 20.0000 mg | DELAYED_RELEASE_CAPSULE | Freq: Every day | ORAL | 1 refills | Status: AC

## 2017-03-08 NOTE — ED Provider Notes (Signed)
[place]   [id number] 03/08/17 Arrival Time: 1220   SUBJECTIVE:  Kelli Bush is a 47 y.o. female who presents to the urgent care with complaint of intermittent chest pain onset today that feels like some pressure, feels like she's going to "pass out"  Patient started on her new medication on Friday and that night she had vomiting.  The vomiting resolved and she took a second dose on Saturday night and Sunday night.  Today at lunch she started feeling nauseated and then developed a different kind of chest pain from any that she is ever had before.  She did not vomit.  Patient has not called her doctor because she said he is out of town.  She says the chest tightness goes away when she lies down.  Reports she was placed on new medication (exemestane) for breast cancer last Friday  Patient has had a double mastectomy 6 years ago and has had no signs of recurrence.  She does have chest tightness from time to time whenever she gets sick but this is a different kind of chest tightness.  Patient has not had any leg cramps, shortness of breath, or fever  Past Medical History:  Diagnosis Date  . Anxiety   . Cancer ([place]) 02/2011   breast  DOUBLE MASTECTOMY  . Headache    MIGRAINES  . History of kidney stones   . Pneumonia 2012   History reviewed. No pertinent family history. Social History   Socioeconomic History  . Marital status: Married    Spouse name: Not on file  . Number of children: Not on file  . Years of education: Not on file  . Highest education level: Not on file  Social Needs  . Financial resource strain: Not on file  . Food insecurity - worry: Not on file  . Food insecurity - inability: Not on file  . Transportation needs - medical: Not on file  . Transportation needs - non-medical: Not on file  Occupational History  . Not on file  Tobacco Use  . Smoking status: Never Smoker  . Smokeless tobacco: Never Used  Substance and Sexual Activity  .  Alcohol use: No  . Drug use: No  . Sexual activity: Not on file  Other Topics Concern  . Not on file  Social History Narrative  . Not on file   Current Meds  Medication Sig  . Cholecalciferol (VITAMIN D3) 2000 units capsule Take 2,000 Units by mouth daily.  Marland Kitchen escitalopram (LEXAPRO) 10 MG tablet Take 10 mg daily by mouth.   Marland Kitchen exemestane (AROMASIN) 25 MG tablet Take 25 mg by mouth daily after breakfast.  . Magnesium 250 MG TABS Take 500 mg daily by mouth.  . Multiple Vitamin (MULTIVITAMIN WITH MINERALS) TABS tablet Take 1 tablet by mouth daily.  . SUMAtriptan (IMITREX) 100 MG tablet Take 50-100 mg every 2 (two) hours as needed by mouth for migraine or headache.   . temazepam (RESTORIL) 30 MG capsule Take 30 mg at bedtime as needed by mouth for sleep.    Allergies  Allergen Reactions  . Dexamethasone   . Penicillins Hives and Other (See Comments)    Has patient had a PCN reaction causing immediate rash, facial/tongue/throat swelling, SOB or lightheadedness with hypotension: No Has patient had a PCN reaction causing severe rash involving mucus membranes or skin necrosis: No Has patient had a PCN reaction that required hospitalization: No Has patient had a PCN reaction occurring within the last 10 years: No  If all of the above answers are "NO", then may proceed with Cephalosporin use.  . Prednisone   . Tetracyclines & Related Other (See Comments)    Made pt sick      ROS: As per HPI, remainder of ROS negative.   OBJECTIVE:   Vitals:   03/08/17 1233  BP: 114/60  Pulse: 78  Resp: 20  Temp: (!) 97.3 F (36.3 C)  TempSrc: Oral  SpO2: 100%     General appearance: alert; no distress but appears apprehensive Eyes: PERRL; EOMI; conjunctiva normal HENT: normocephalic; atraumatic;  nasal mucosa normal; oral mucosa normal Neck: supple Lungs: clear to auscultation bilaterally Heart: regular rate and rhythm Abdomen: soft, non-tender; bowel sounds normal; no masses or  organomegaly; no guarding or rebound tenderness Back: no CVA tenderness Extremities: no cyanosis or edema; symmetrical with no gross deformities Skin: warm and dry Neurologic: normal gait; grossly normal Psychological: alert and cooperative; normal mood and affect    EKG: Normal sinus rhythm  Labs:  Results for orders placed or performed during the hospital encounter of 03/08/17  I-STAT, chem 8  Result Value Ref Range   Sodium 143 135 - 145 mmol/L   Potassium 3.6 3.5 - 5.1 mmol/L   Chloride 105 101 - 111 mmol/L   BUN 17 6 - 20 mg/dL   Creatinine, Ser 0.80 0.44 - 1.00 mg/dL   Glucose, Bld 87 65 - 99 mg/dL   Calcium, Ion 1.19 1.15 - 1.40 mmol/L   TCO2 26 22 - 32 mmol/L   Hemoglobin 14.6 12.0 - 15.0 g/dL   HCT 43.0 36.0 - 46.0 %    Labs Reviewed  POCT I-STAT, CHEM 8    Dg Chest 2 View  Result Date: 03/08/2017 CLINICAL DATA:  Chest pain. EXAM: CHEST  2 VIEW COMPARISON:  None. FINDINGS: The heart size and mediastinal contours are within normal limits. Both lungs are clear. No pneumothorax or pleural effusion is noted. The visualized skeletal structures are unremarkable. IMPRESSION: No active cardiopulmonary disease. Electronically Signed   By: Marijo Conception, M.D.   On: 03/08/2017 13:45       ASSESSMENT & PLAN:  No diagnosis found.  Meds ordered this encounter  Medications  . omeprazole (PRILOSEC) 20 MG capsule    Sig: Take 1 capsule (20 mg total) by mouth daily.    Dispense:  30 capsule    Refill:  1    Reviewed expectations re: course of current medical issues. Questions answered. Outlined signs and symptoms indicating need for more acute intervention. Patient verbalized understanding. After Visit Summary given.    Procedures:      Robyn Haber, MD 03/08/17 1355

## 2017-03-08 NOTE — Discharge Instructions (Signed)
The EKG is reassuring as are the labs and the chest x-ray.  I think most likely, the problem is the new medication.  I recommend that you call your doctors at the cancer center and explain that you were evaluated here and we found no good explanation other than the new medication.  I asked them what they would recommend from here.   CHEST  2 VIEW   COMPARISON:  None.   FINDINGS: The heart size and mediastinal contours are within normal limits. Both lungs are clear. No pneumothorax or pleural effusion is noted. The visualized skeletal structures are unremarkable.   IMPRESSION: No active cardiopulmonary disease.     Electronically Signed   By: Marijo Conception, M.D.   On: 03/08/2017 13:45

## 2017-03-08 NOTE — ED Triage Notes (Signed)
PT C/O: intermittent chest pain onset today that feels like some pressure, feels like she's going to "pass out"  Reports she was placed on new medication (exemestane) for breast cancer and was feeling nauseas and had vomiting, HA, diaphoresis  DENIES: SOB/Dyspnea, blurred  TAKING MEDS: none   A&O x4... NAD... Ambulatory

## 2017-06-23 DIAGNOSIS — Z6821 Body mass index (BMI) 21.0-21.9, adult: Secondary | ICD-10-CM | POA: Diagnosis not present

## 2017-06-23 DIAGNOSIS — Z170 Estrogen receptor positive status [ER+]: Secondary | ICD-10-CM | POA: Diagnosis not present

## 2017-06-23 DIAGNOSIS — C50411 Malignant neoplasm of upper-outer quadrant of right female breast: Secondary | ICD-10-CM | POA: Diagnosis not present

## 2017-10-21 DIAGNOSIS — N651 Disproportion of reconstructed breast: Secondary | ICD-10-CM | POA: Diagnosis not present

## 2017-10-21 DIAGNOSIS — Z853 Personal history of malignant neoplasm of breast: Secondary | ICD-10-CM | POA: Diagnosis not present

## 2017-11-23 DIAGNOSIS — Z853 Personal history of malignant neoplasm of breast: Secondary | ICD-10-CM | POA: Diagnosis not present

## 2017-11-23 DIAGNOSIS — Z45811 Encounter for adjustment or removal of right breast implant: Secondary | ICD-10-CM | POA: Diagnosis not present

## 2017-11-23 DIAGNOSIS — N650 Deformity of reconstructed breast: Secondary | ICD-10-CM | POA: Diagnosis not present

## 2017-11-23 DIAGNOSIS — N651 Disproportion of reconstructed breast: Secondary | ICD-10-CM | POA: Diagnosis not present

## 2017-11-23 DIAGNOSIS — T8579XA Infection and inflammatory reaction due to other internal prosthetic devices, implants and grafts, initial encounter: Secondary | ICD-10-CM | POA: Diagnosis not present

## 2017-11-23 DIAGNOSIS — T66XXXS Radiation sickness, unspecified, sequela: Secondary | ICD-10-CM | POA: Diagnosis not present

## 2017-12-14 DIAGNOSIS — J029 Acute pharyngitis, unspecified: Secondary | ICD-10-CM | POA: Diagnosis not present

## 2018-01-19 DIAGNOSIS — Z170 Estrogen receptor positive status [ER+]: Secondary | ICD-10-CM | POA: Diagnosis not present

## 2018-01-19 DIAGNOSIS — C50411 Malignant neoplasm of upper-outer quadrant of right female breast: Secondary | ICD-10-CM | POA: Diagnosis not present

## 2018-01-19 DIAGNOSIS — Z6821 Body mass index (BMI) 21.0-21.9, adult: Secondary | ICD-10-CM | POA: Diagnosis not present

## 2018-01-21 ENCOUNTER — Other Ambulatory Visit: Payer: Self-pay | Admitting: Hematology & Oncology

## 2018-01-21 DIAGNOSIS — E2839 Other primary ovarian failure: Secondary | ICD-10-CM

## 2018-01-27 ENCOUNTER — Other Ambulatory Visit: Payer: Self-pay | Admitting: Hematology & Oncology

## 2018-01-27 DIAGNOSIS — Z79811 Long term (current) use of aromatase inhibitors: Secondary | ICD-10-CM

## 2018-01-27 DIAGNOSIS — C50411 Malignant neoplasm of upper-outer quadrant of right female breast: Secondary | ICD-10-CM

## 2018-01-31 ENCOUNTER — Ambulatory Visit
Admission: RE | Admit: 2018-01-31 | Discharge: 2018-01-31 | Disposition: A | Discharge status: Home / Self Care | Payer: 59 | Source: Ambulatory Visit | Attending: Hematology & Oncology | Admitting: Hematology & Oncology

## 2018-01-31 DIAGNOSIS — M85851 Other specified disorders of bone density and structure, right thigh: Secondary | ICD-10-CM | POA: Diagnosis not present

## 2018-01-31 DIAGNOSIS — Z79811 Long term (current) use of aromatase inhibitors: Secondary | ICD-10-CM

## 2018-01-31 DIAGNOSIS — Z780 Asymptomatic menopausal state: Secondary | ICD-10-CM | POA: Diagnosis not present

## 2018-01-31 DIAGNOSIS — C50411 Malignant neoplasm of upper-outer quadrant of right female breast: Secondary | ICD-10-CM

## 2018-03-16 DIAGNOSIS — Z01419 Encounter for gynecological examination (general) (routine) without abnormal findings: Secondary | ICD-10-CM | POA: Diagnosis not present

## 2018-04-13 DIAGNOSIS — M25561 Pain in right knee: Secondary | ICD-10-CM | POA: Diagnosis not present

## 2018-04-13 DIAGNOSIS — S838X1A Sprain of other specified parts of right knee, initial encounter: Secondary | ICD-10-CM | POA: Diagnosis not present

## 2018-04-13 DIAGNOSIS — S52501A Unspecified fracture of the lower end of right radius, initial encounter for closed fracture: Secondary | ICD-10-CM | POA: Diagnosis not present

## 2018-04-15 DIAGNOSIS — S52509A Unspecified fracture of the lower end of unspecified radius, initial encounter for closed fracture: Secondary | ICD-10-CM | POA: Diagnosis not present

## 2018-04-15 DIAGNOSIS — S838X9A Sprain of other specified parts of unspecified knee, initial encounter: Secondary | ICD-10-CM | POA: Diagnosis not present

## 2018-04-20 DIAGNOSIS — M25461 Effusion, right knee: Secondary | ICD-10-CM | POA: Diagnosis not present

## 2018-04-20 DIAGNOSIS — M25561 Pain in right knee: Secondary | ICD-10-CM | POA: Diagnosis not present

## 2018-04-20 DIAGNOSIS — M25531 Pain in right wrist: Secondary | ICD-10-CM | POA: Diagnosis not present

## 2018-05-04 DIAGNOSIS — M25561 Pain in right knee: Secondary | ICD-10-CM | POA: Diagnosis not present

## 2018-05-05 DIAGNOSIS — M25531 Pain in right wrist: Secondary | ICD-10-CM | POA: Diagnosis not present

## 2018-05-05 DIAGNOSIS — S83511A Sprain of anterior cruciate ligament of right knee, initial encounter: Secondary | ICD-10-CM | POA: Diagnosis not present

## 2018-05-19 DIAGNOSIS — S63592A Other specified sprain of left wrist, initial encounter: Secondary | ICD-10-CM | POA: Diagnosis not present

## 2018-06-07 DIAGNOSIS — S83511A Sprain of anterior cruciate ligament of right knee, initial encounter: Secondary | ICD-10-CM | POA: Diagnosis not present

## 2018-06-13 DIAGNOSIS — R262 Difficulty in walking, not elsewhere classified: Secondary | ICD-10-CM | POA: Diagnosis not present

## 2018-06-13 DIAGNOSIS — S83511D Sprain of anterior cruciate ligament of right knee, subsequent encounter: Secondary | ICD-10-CM | POA: Diagnosis not present

## 2018-06-24 DIAGNOSIS — G8918 Other acute postprocedural pain: Secondary | ICD-10-CM | POA: Diagnosis not present

## 2018-06-24 DIAGNOSIS — S83511A Sprain of anterior cruciate ligament of right knee, initial encounter: Secondary | ICD-10-CM | POA: Diagnosis not present

## 2019-04-18 IMAGING — DX DG CHEST 2V
2 series · 2 of 2 positions shown · non-contrast
Comparison: None.

CLINICAL DATA: Chest pain.

EXAM:
CHEST  2 VIEW

[chest pa]
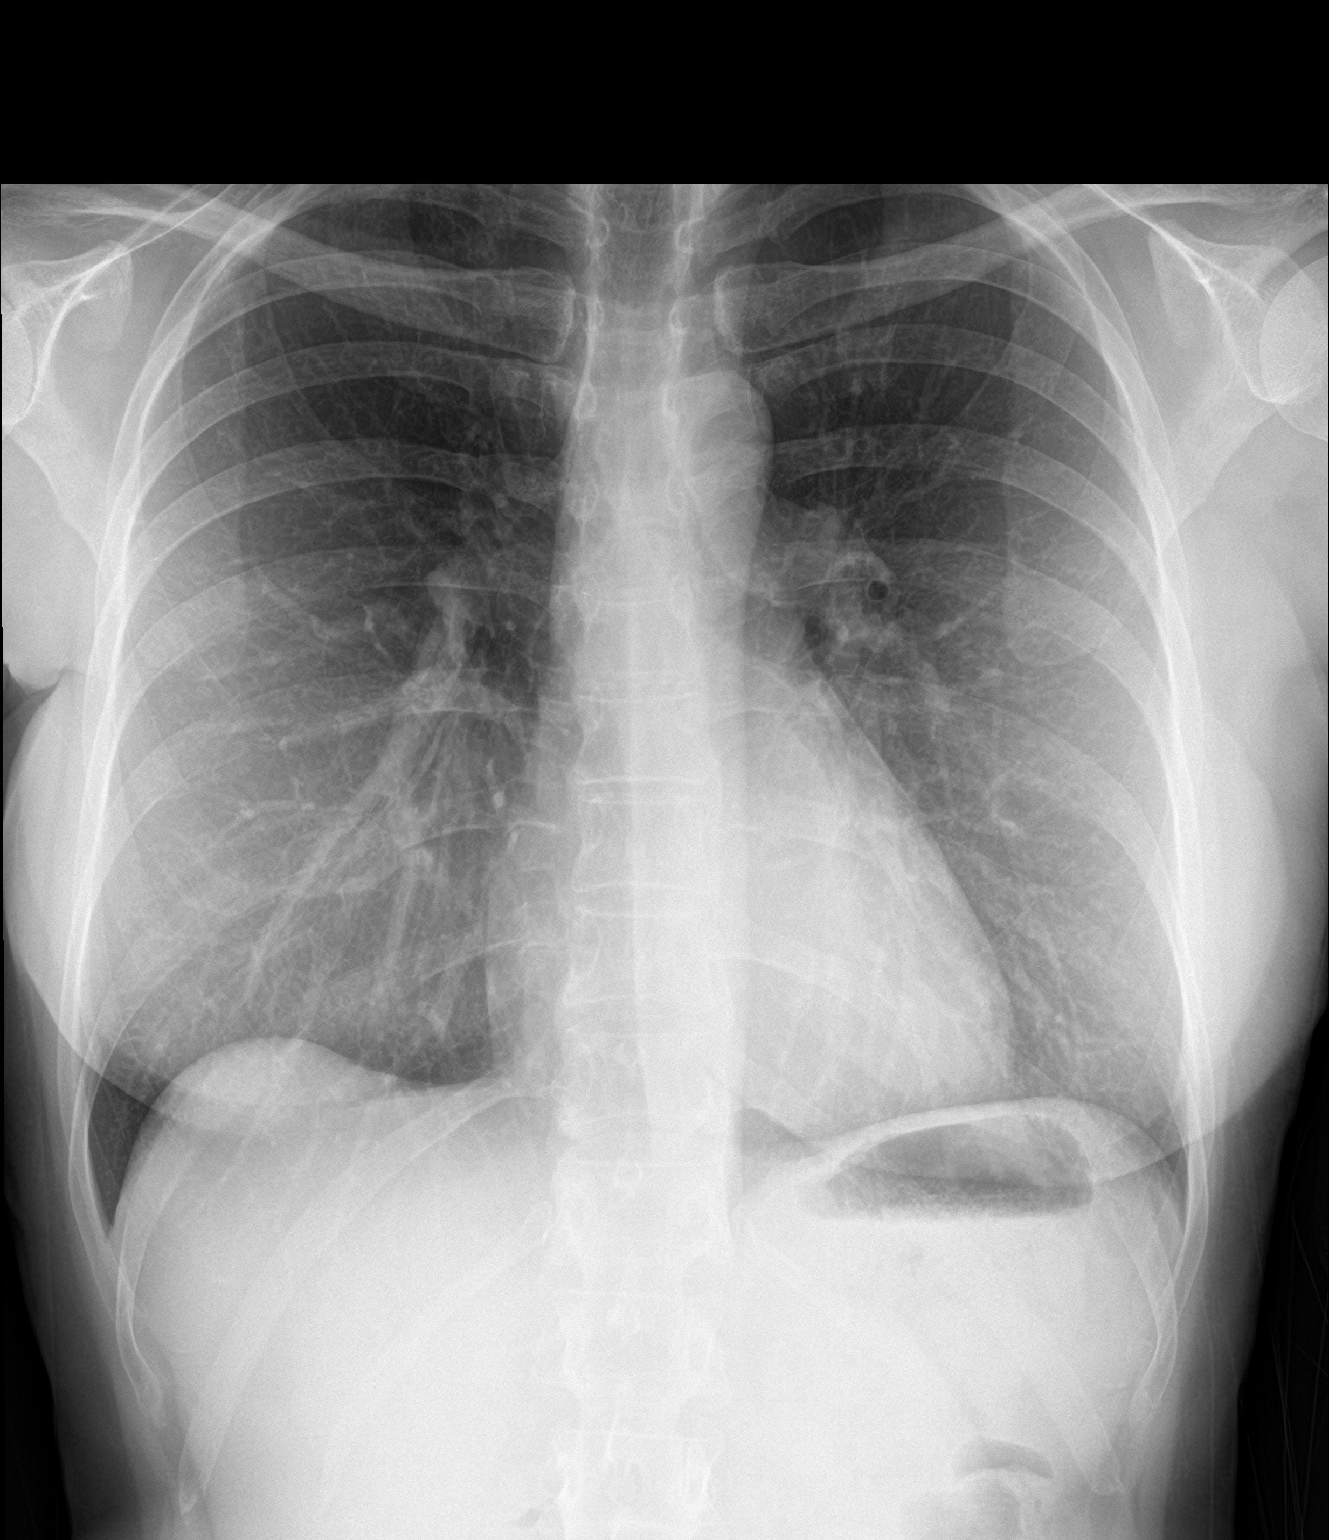

[chest lat]
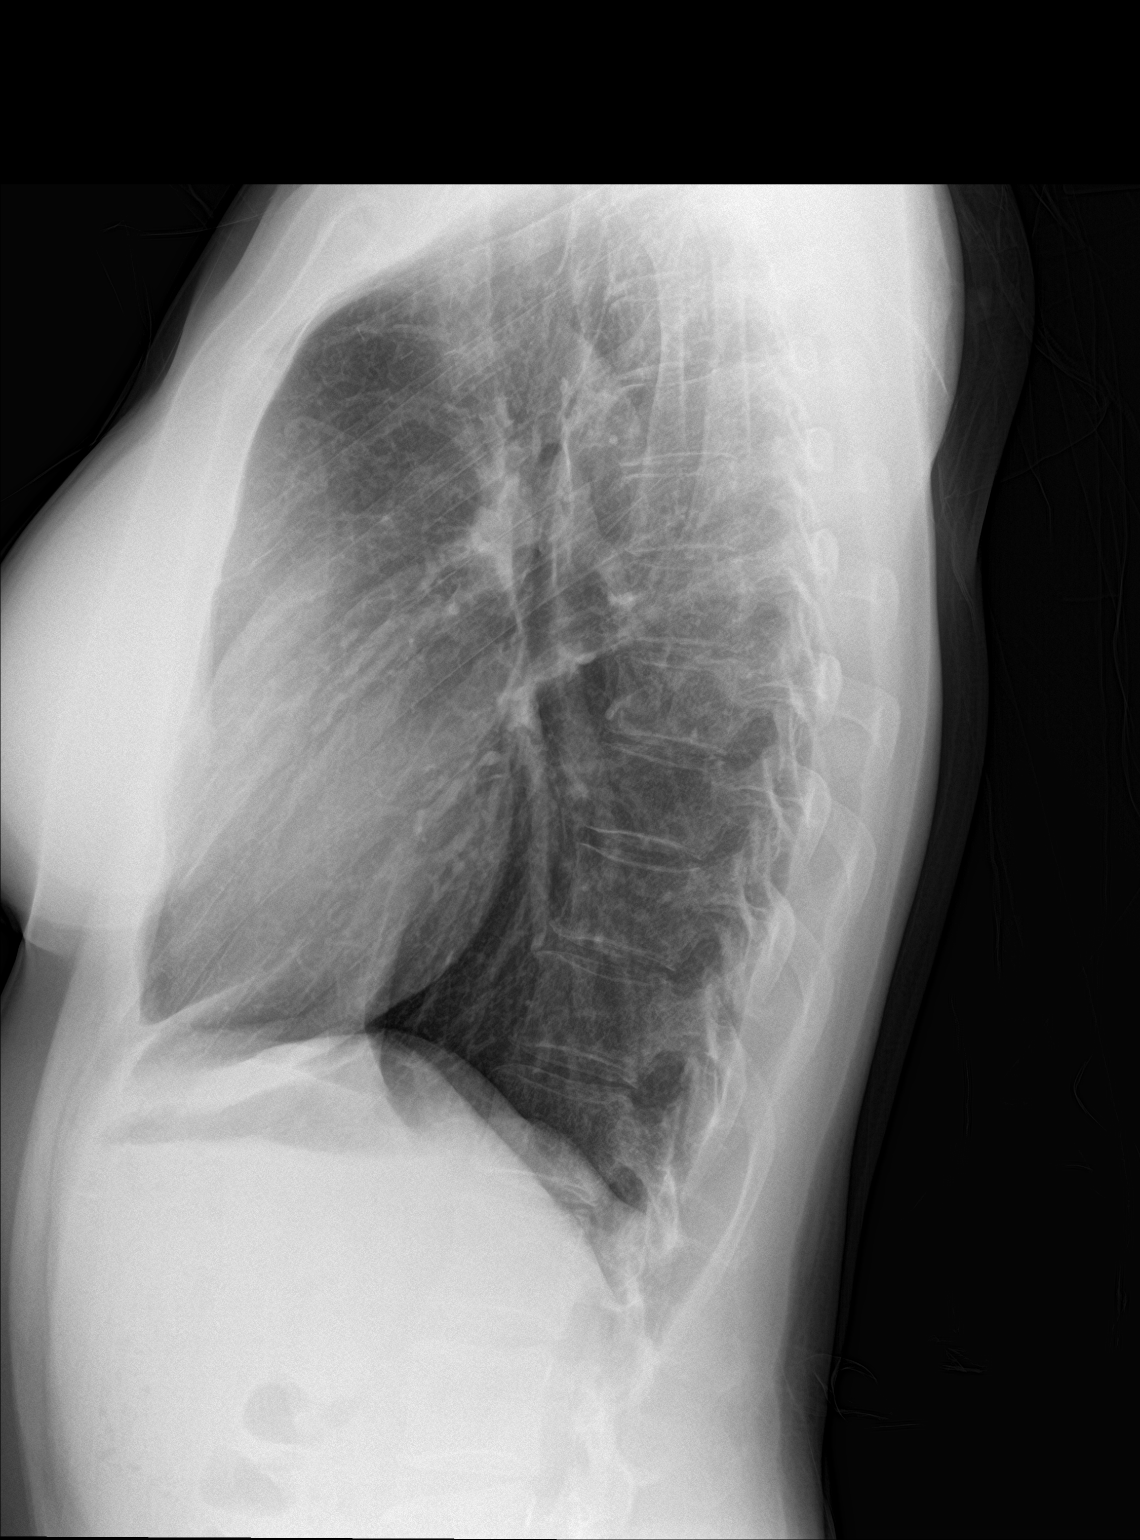

[2 of 2 positions shown; findings below may reference images not displayed]

FINDINGS: The heart size and mediastinal contours are within normal limits.
Both lungs are clear. No pneumothorax or pleural effusion is noted.
The visualized skeletal structures are unremarkable.
IMPRESSION: No active cardiopulmonary disease.

## 2021-04-04 ENCOUNTER — Telehealth (HOSPITAL_COMMUNITY): Payer: Self-pay | Admitting: Emergency Medicine

## 2021-04-04 ENCOUNTER — Emergency Department (HOSPITAL_COMMUNITY)
Admission: EM | Admit: 2021-04-04 | Discharge: 2021-04-04 | Disposition: A | Discharge status: Home / Self Care | Payer: BLUE CROSS/BLUE SHIELD | Attending: Emergency Medicine | Admitting: Emergency Medicine

## 2021-04-04 ENCOUNTER — Emergency Department (HOSPITAL_COMMUNITY): Payer: BLUE CROSS/BLUE SHIELD

## 2021-04-04 ENCOUNTER — Other Ambulatory Visit: Payer: Self-pay

## 2021-04-04 ENCOUNTER — Telehealth: Payer: Self-pay | Admitting: Surgery

## 2021-04-04 DIAGNOSIS — N12 Tubulo-interstitial nephritis, not specified as acute or chronic: Secondary | ICD-10-CM | POA: Insufficient documentation

## 2021-04-04 DIAGNOSIS — R1032 Left lower quadrant pain: Secondary | ICD-10-CM | POA: Diagnosis present

## 2021-04-04 LAB — CBC WITH DIFFERENTIAL/PLATELET
Abs Immature Granulocytes: 0.03 10*3/uL (ref 0.00–0.07)
Basophils Absolute: 0 10*3/uL (ref 0.0–0.1)
Basophils Relative: 0 %
Eosinophils Absolute: 0.4 10*3/uL (ref 0.0–0.5)
Eosinophils Relative: 5 %
HCT: 43 % (ref 36.0–46.0)
Hemoglobin: 14.3 g/dL (ref 12.0–15.0)
Immature Granulocytes: 0 %
Lymphocytes Relative: 21 %
Lymphs Abs: 1.9 10*3/uL (ref 0.7–4.0)
MCH: 31 pg (ref 26.0–34.0)
MCHC: 33.3 g/dL (ref 30.0–36.0)
MCV: 93.1 fL (ref 80.0–100.0)
Monocytes Absolute: 0.6 10*3/uL (ref 0.1–1.0)
Monocytes Relative: 6 %
Neutro Abs: 6.2 10*3/uL (ref 1.7–7.7)
Neutrophils Relative %: 68 %
Platelets: 254 10*3/uL (ref 150–400)
RBC: 4.62 MIL/uL (ref 3.87–5.11)
RDW: 11.9 % (ref 11.5–15.5)
WBC: 9.1 10*3/uL (ref 4.0–10.5)
nRBC: 0 % (ref 0.0–0.2)

## 2021-04-04 LAB — URINALYSIS, ROUTINE W REFLEX MICROSCOPIC
Bilirubin Urine: NEGATIVE
Glucose, UA: NEGATIVE mg/dL
Hgb urine dipstick: NEGATIVE
Ketones, ur: 5 mg/dL — AB
Nitrite: NEGATIVE
Protein, ur: 30 mg/dL — AB
Specific Gravity, Urine: 1.024 (ref 1.005–1.030)
pH: 6 (ref 5.0–8.0)

## 2021-04-04 LAB — COMPREHENSIVE METABOLIC PANEL
ALT: 16 U/L (ref 0–44)
AST: 13 U/L — ABNORMAL LOW (ref 15–41)
Albumin: 3.8 g/dL (ref 3.5–5.0)
Alkaline Phosphatase: 64 U/L (ref 38–126)
Anion gap: 9 (ref 5–15)
BUN: 14 mg/dL (ref 6–20)
CO2: 23 mmol/L (ref 22–32)
Calcium: 9 mg/dL (ref 8.9–10.3)
Chloride: 105 mmol/L (ref 98–111)
Creatinine, Ser: 0.8 mg/dL (ref 0.44–1.00)
GFR, Estimated: >60 mL/min (ref >60)
Glucose, Bld: 97 mg/dL (ref 70–99)
Potassium: 4.2 mmol/L (ref 3.5–5.1)
Sodium: 137 mmol/L (ref 135–145)
Total Bilirubin: 0.7 mg/dL (ref 0.3–1.2)
Total Protein: 7 g/dL (ref 6.5–8.1)

## 2021-04-04 MED ORDER — CEFPODOXIME PROXETIL 200 MG PO TABS: 200.0000 mg | ORAL_TABLET | Freq: Two times a day (BID) | ORAL | 0 refills | Status: AC

## 2021-04-04 MED ORDER — CEFPODOXIME PROXETIL 200 MG PO TABS: 200.0000 mg | ORAL_TABLET | Freq: Two times a day (BID) | ORAL | 0 refills | Status: DC

## 2021-04-04 MED ORDER — CEFTRIAXONE SODIUM 1 G IJ SOLR
1.0000 g | Freq: Once | INTRAMUSCULAR | Status: AC
  Administered 2021-04-04: 1 g via INTRAMUSCULAR
  Filled 2021-04-04: qty 10

## 2021-04-04 MED ORDER — OXYCODONE-ACETAMINOPHEN 5-325 MG PO TABS
1.0000 | ORAL_TABLET | Freq: Once | ORAL | Status: AC
  Administered 2021-04-04: 1 via ORAL
  Filled 2021-04-04: qty 1

## 2021-04-04 MED ORDER — HYDROCODONE-ACETAMINOPHEN 5-325 MG PO TABS: 1.0000 | ORAL_TABLET | ORAL | 0 refills | Status: AC | PRN

## 2021-04-04 MED ORDER — ONDANSETRON HCL 4 MG PO TABS: 4.0000 mg | ORAL_TABLET | ORAL | 0 refills | Status: DC | PRN

## 2021-04-04 MED ORDER — KETOROLAC TROMETHAMINE 30 MG/ML IJ SOLN
30.0000 mg | Freq: Once | INTRAMUSCULAR | Status: AC
  Administered 2021-04-04: 30 mg via INTRAMUSCULAR
  Filled 2021-04-04: qty 1

## 2021-04-04 MED ORDER — HYDROCODONE-ACETAMINOPHEN 5-325 MG PO TABS
1.0000 | ORAL_TABLET | Freq: Once | ORAL | Status: AC
  Administered 2021-04-04: 1 via ORAL
  Filled 2021-04-04: qty 1

## 2021-04-04 MED ORDER — HYDROCODONE-ACETAMINOPHEN 5-325 MG PO TABS: 1.0000 | ORAL_TABLET | ORAL | 0 refills | Status: DC | PRN

## 2021-04-04 MED ORDER — ONDANSETRON 4 MG PO TBDP: 4.0000 mg | ORAL_TABLET | Freq: Three times a day (TID) | ORAL | 0 refills | Status: AC | PRN

## 2021-04-04 NOTE — ED Provider Triage Note (Signed)
Emergency Medicine Provider Triage Evaluation Note  Kelli Bush , a 51 y.o. female  was evaluated in triage.  Pt complains of left flank and left lower abdominal pain.  Pain started this weekend and has been getting progressively worse since then.  Patient had marked increase in pain 2 days prior.  Patient reports that pain started in left flank and has migrated to left lower quadrant.  Patient endorses difficulty urinating.  Denies any fever, chills, nausea, vomiting, dysuria, hematuria.  No known history of diverticulitis  Review of Systems  Positive: Left flank pain, left lower abdominal pain, difficulty urinating Negative: See above  Physical Exam  BP 109/82 (BP Location: Left Arm)    Pulse 86    Temp 97.6 F (36.4 C) (Oral)    Resp 20    SpO2 97%  Gen:   Awake, no distress   Resp:  Normal effort  MSK:   Moves extremities without difficulty  Other:    Medical Decision Making  Medically screening exam initiated at 12:13 PM.  Appropriate orders placed.  Kelli Bush was informed that the remainder of the evaluation will be completed by another provider, this initial triage assessment does not replace that evaluation, and the importance of remaining in the ED until their evaluation is complete.  Concern for kidney stone.  Will order noncontrast renal CT.   Kelli Bush, [place] 04/04/21 1214

## 2021-04-04 NOTE — Discharge Instructions (Addendum)
It was a pleasure caring for you today in the emergency department. ° °Please return to the emergency department for any worsening or worrisome symptoms. ° ° °

## 2021-04-04 NOTE — ED Provider Notes (Signed)
[facility] EMERGENCY DEPARTMENT Provider Note   CSN: [id number] Arrival date & time: 04/04/21  1147     History  Chief Complaint  Patient presents with   Abdominal Pain   Back Pain    Kelli Bush is a 51 y.o. female.  This is a 50 y.o. female  with significant medical history as below, including mastectomy b/l, kidney stone, appendectomy who presents to the ED with complaint of flank pain. Onset 2 days ago. Left flank/LLQ. Feels similar to prior kidney stone. Subjective fever, dysuria, mild nausea with out emesis. No cp or dib. No abn vaginal bleeding or discharge. No change to bowel activity. No rashes.      Past Medical History: No date: Anxiety 02/2011: Cancer ([place])     Comment:  breast  DOUBLE MASTECTOMY No date: Headache     Comment:  MIGRAINES No date: History of kidney stones 2012: Pneumonia  Past Surgical History: 11/17/2016: APPENDECTOMY No date: HAND SURGERY No date: KNEE SURGERY 11/17/2016: LAPAROSCOPIC APPENDECTOMY; N/A     Comment:  Procedure: APPENDECTOMY LAPAROSCOPIC;  Surgeon: Ralene Ok, MD;  Location: [facility];  Service: General;                Laterality: N/A; 01/15/2017: LAPAROSCOPIC VAGINAL HYSTERECTOMY WITH [place]  OOPHORECTOMY; Bilateral     Comment:  Procedure: LAPAROSCOPIC ASSISTED VAGINAL HYSTERECTOMY               WITH BILATERAL [place]- OOPHORECTOMY;  Surgeon: Christophe Louis, MD;  Location: [facility];  Service: Gynecology;                Laterality: Bilateral; 2013: MASTECTOMY     Comment:  BILATERAL     The history is provided by the patient. No language interpreter was used.  Abdominal Pain Associated symptoms: dysuria and nausea   Associated symptoms: no chest pain, no chills, no cough, no fever, no hematuria, no shortness of breath and no vomiting   Back Pain Associated symptoms: abdominal pain and dysuria   Associated symptoms: no chest pain, no fever and no headaches        Home Medications Prior to Admission medications   Medication Sig Start Date End Date Taking? Authorizing Provider  cefpodoxime (VANTIN) 200 MG tablet Take 1 tablet (200 mg total) by mouth 2 (two) times daily for 10 days. 04/04/21 04/14/21  [name], PA-C  Cholecalciferol (VITAMIN D3) 2000 units capsule Take 2,000 Units by mouth daily.    [provider]  escitalopram (LEXAPRO) 10 MG tablet Take 10 mg daily by mouth.     [provider]  exemestane (AROMASIN) 25 MG tablet Take 25 mg by mouth daily after breakfast.    [provider]  HYDROcodone-acetaminophen (NORCO/VICODIN) 5-325 MG tablet Take 1 tablet by mouth every 4 (four) hours as needed. 04/04/21   [name], PA-C  ibuprofen (ADVIL,MOTRIN) 800 MG tablet Take 1 tablet (800 mg total) by mouth every 8 (eight) hours as needed (mild pain). 01/16/17   Christophe Louis, MD  Magnesium 250 MG TABS Take 500 mg daily by mouth.    [provider]  Multiple Vitamin (MULTIVITAMIN WITH MINERALS) TABS tablet Take 1 tablet by mouth daily.    [provider]  omeprazole (PRILOSEC) 20 MG capsule Take 1 capsule (20 mg total) by mouth daily. 03/08/17  Robyn Haber, MD  ondansetron (ZOFRAN-ODT) 4 MG disintegrating tablet Take 1 tablet (4 mg total) by mouth every 8 (eight) hours as needed for nausea or vomiting. 04/04/21   [name], PA-C  SUMAtriptan (IMITREX) 100 MG tablet Take 50-100 mg every 2 (two) hours as needed by mouth for migraine or headache.  12/08/16   [provider]  tamoxifen (NOLVADEX) 20 MG tablet Take 20 mg by mouth daily.    [provider]  temazepam (RESTORIL) 30 MG capsule Take 30 mg at bedtime as needed by mouth for sleep.     [provider]      Allergies    Dexamethasone, Penicillins, Prednisone, and Tetracyclines & related    Review of Systems   Review of Systems  Constitutional:  Negative for chills and fever.  HENT:  Negative for  facial swelling and trouble swallowing.   Eyes:  Negative for photophobia and visual disturbance.  Respiratory:  Negative for cough and shortness of breath.   Cardiovascular:  Negative for chest pain and palpitations.  Gastrointestinal:  Positive for abdominal pain and nausea. Negative for vomiting.  Endocrine: Negative for polydipsia and polyuria.  Genitourinary:  Positive for dysuria and flank pain. Negative for difficulty urinating and hematuria.  Musculoskeletal:  Positive for back pain. Negative for gait problem and joint swelling.  Skin:  Negative for pallor and rash.  Neurological:  Negative for syncope and headaches.  Psychiatric/Behavioral:  Negative for agitation and confusion.    Physical Exam Updated Vital Signs BP 117/66 (BP Location: Left Arm)    Pulse 64    Temp 98.2 F (36.8 C) (Oral)    Resp 16    SpO2 97%  Physical Exam Vitals and nursing note reviewed.  Constitutional:      General: She is not in acute distress.    Appearance: Normal appearance. She is well-developed. She is not ill-appearing, toxic-appearing or diaphoretic.  HENT:     Head: Normocephalic and atraumatic.     Right Ear: External ear normal.     Left Ear: External ear normal.     Nose: Nose normal.     Mouth/Throat:     Mouth: Mucous membranes are moist.  Eyes:     General: No scleral icterus.       Right eye: No discharge.        Left eye: No discharge.  Cardiovascular:     Rate and Rhythm: Normal rate and regular rhythm.     Pulses: Normal pulses.     Heart sounds: Normal heart sounds.  Pulmonary:     Effort: Pulmonary effort is normal. No respiratory distress.     Breath sounds: Normal breath sounds.  Abdominal:     General: Abdomen is flat.     Palpations: Abdomen is soft.     Tenderness: There is abdominal tenderness. There is left CVA tenderness. There is no right CVA tenderness, guarding or rebound.     Comments: Non surgical abdomen  Musculoskeletal:        General: Normal range  of motion.     Cervical back: Normal range of motion.     Right lower leg: No edema.     Left lower leg: No edema.  Skin:    General: Skin is warm and dry.     Capillary Refill: Capillary refill takes less than 2 seconds.  Neurological:     Mental Status: She is alert.  Psychiatric:        Mood and  Affect: Mood normal.        Behavior: Behavior normal.    ED Results / Procedures / Treatments   Labs (all labs ordered are listed, but only abnormal results are displayed) Labs Reviewed  COMPREHENSIVE METABOLIC PANEL - Abnormal; Notable for the following components:      Result Value   AST 13 (*)    All other components within normal limits  URINALYSIS, ROUTINE W REFLEX MICROSCOPIC - Abnormal; Notable for the following components:   Color, Urine AMBER (*)    APPearance CLOUDY (*)    Ketones, ur 5 (*)    Protein, ur 30 (*)    Leukocytes,Ua LARGE (*)    Bacteria, UA MANY (*)    Non Squamous Epithelial 0-5 (*)    All other components within normal limits  URINE CULTURE  CBC WITH DIFFERENTIAL/PLATELET    EKG None  Radiology CT Renal Stone Study  Result Date: 04/04/2021 CLINICAL DATA:  Left flank and lower abdominal pain. EXAM: CT ABDOMEN AND PELVIS WITHOUT CONTRAST TECHNIQUE: Multidetector CT imaging of the abdomen and pelvis was performed following the standard protocol without IV contrast. RADIATION DOSE REDUCTION: This exam was performed according to the departmental dose-optimization program which includes automated exposure control, adjustment of the mA and/or kV according to patient size and/or use of iterative reconstruction technique. COMPARISON:  CT abdomen pelvis 11/17/2016 FINDINGS: Lower chest: No acute abnormality. Evaluation of the abdominal viscera is limited by the lack of IV contrast. Hepatobiliary: No focal liver abnormality is seen. No gallstones, gallbladder wall thickening, or biliary dilatation. Pancreas: Unremarkable. No surrounding inflammatory changes. Spleen:  Normal in size without focal abnormality. Adrenals/Urinary Tract: Adrenal glands are unremarkable. Kidneys are normal, without renal calculi, focal lesion, or hydronephrosis. Bladder is unremarkable. Stomach/Bowel: Stomach is within normal limits. No evidence of bowel wall thickening, distention, or inflammatory changes. Vascular/Lymphatic: No significant vascular findings are present. No enlarged abdominal or pelvic lymph nodes. Reproductive: Status post hysterectomy. No adnexal masses. Other: No abdominal wall hernia or abnormality. No abdominopelvic ascites. Musculoskeletal: No acute or significant osseous findings. IMPRESSION: No acute intra-abdominal or pelvic pathology on a noncontrast exam. No evidence of renal calculi. Electronically Signed   By: Audie Pinto M.D.   On: 04/04/2021 12:44    Procedures Procedures    Medications Ordered in ED Medications  oxyCODONE-acetaminophen (PERCOCET/ROXICET) 5-325 MG per tablet 1 tablet (1 tablet Oral Given 04/04/21 1218)  ketorolac (TORADOL) 30 MG/ML injection 30 mg (30 mg Intramuscular Given 04/04/21 1449)  HYDROcodone-acetaminophen (NORCO/VICODIN) 5-325 MG per tablet 1 tablet (1 tablet Oral Given 04/04/21 1450)  cefTRIAXone (ROCEPHIN) injection 1 g (1 g Intramuscular Given 04/04/21 1450)    ED Course/ Medical Decision Making/ A&P                           Medical Decision Making  CC: flank pain/abd pain  This patient presents to the Emergency Department for the above complaint. This involves an extensive number of treatment options and is a complaint that carries with it a high risk of complications and morbidity. Vital signs were reviewed. Serious etiologies considered.  Record review:  Previous records obtained and reviewed   Medical and surgical history as noted above.   Work up as above, notable for:   Labs & imaging results that were available during my care of the patient were reviewed by me and considered in my medical decision  making.   I ordered imaging studies which  included ct renal stone and I reviewed imaging which showed no acute process, no kidney stone  Social determinants of health include - N/a  Management: analgesics  Reassessment:  Pt reports feeling much better. High suspicion for pyelonephritis given subjective fevers, nausea, UTI on testing. Will start oral abx after reviewing prior urine cultures. She is tolerating PO. Give analgesics for home, close o/p f/u  Hospitalization was considered however feel pt would be good candidate for trial outpatient therapy with strict return precautions.   The patient improved significantly and was discharged in stable condition. Detailed discussions were had with the patient regarding current findings, and need for close f/u with PCP or on call doctor. The patient has been instructed to return immediately if the symptoms worsen in any way for re-evaluation. Patient verbalized understanding and is in agreement with current care plan. All questions answered prior to discharge.          This chart was dictated using voice recognition software.  Despite best efforts to proofread,  errors can occur which can change the documentation meaning.    Amount and/or Complexity of Data Reviewed External Data Reviewed: labs, radiology and notes. Labs: ordered. Decision-making details documented in ED Course. Radiology: ordered. Decision-making details documented in ED Course.  Risk Prescription drug management.           Final Clinical Impression(s) / ED Diagnoses Final diagnoses:  Pyelonephritis    Rx / DC Orders ED Discharge Orders          Ordered    cefpodoxime (VANTIN) 200 MG tablet  2 times daily,   Status:  Discontinued        04/04/21 1414    HYDROcodone-acetaminophen (NORCO/VICODIN) 5-325 MG tablet  Every 4 hours PRN,   Status:  Discontinued        04/04/21 1414    ondansetron (ZOFRAN) 4 MG tablet  Every 4 hours PRN,   Status:   Discontinued        04/04/21 [place], [place], DO 04/04/21 2020

## 2021-04-04 NOTE — ED Triage Notes (Signed)
Pt here POV d/t back pain radiating to left lower abdomen. Denies blood in urine. 7/10 pain.

## 2021-04-04 NOTE — Telephone Encounter (Signed)
I was contacted by case manager Laurena Slimmer who reported that patient's pharmacy is closed for the weekend and she is requesting her prescriptions to be transferred to a different pharmacy.  I have placed prescriptions for Vantin, Norco, and Zofran to [facility] on [address].  Patient was informed by case Freight forwarder.

## 2021-04-04 NOTE — Telephone Encounter (Signed)
ED RNCM received call from patient concerning prescriptions sent to [facility] on [address] being closed due to staffing. Patient is requesting Sent message to [place] to follow up.

## 2021-04-05 LAB — URINE CULTURE
